# Patient Record
Sex: Female | Born: 1950 | Race: White | Hispanic: No | Marital: Married | Smoking: Never smoker
Health system: Southern US, Community
[De-identification: ages and names within clinical notes are randomized; demographics above are authoritative.]

## PROBLEM LIST (undated history)

## (undated) DIAGNOSIS — F329 Major depressive disorder, single episode, unspecified: Secondary | ICD-10-CM

## (undated) DIAGNOSIS — Z8042 Family history of malignant neoplasm of prostate: Secondary | ICD-10-CM

## (undated) DIAGNOSIS — R112 Nausea with vomiting, unspecified: Secondary | ICD-10-CM

## (undated) DIAGNOSIS — J302 Other seasonal allergic rhinitis: Secondary | ICD-10-CM

## (undated) DIAGNOSIS — Z803 Family history of malignant neoplasm of breast: Secondary | ICD-10-CM

## (undated) DIAGNOSIS — T4145XA Adverse effect of unspecified anesthetic, initial encounter: Secondary | ICD-10-CM

## (undated) DIAGNOSIS — F419 Anxiety disorder, unspecified: Secondary | ICD-10-CM

## (undated) DIAGNOSIS — Z9889 Other specified postprocedural states: Secondary | ICD-10-CM

## (undated) DIAGNOSIS — K219 Gastro-esophageal reflux disease without esophagitis: Secondary | ICD-10-CM

## (undated) DIAGNOSIS — T8859XA Other complications of anesthesia, initial encounter: Secondary | ICD-10-CM

## (undated) DIAGNOSIS — M1990 Unspecified osteoarthritis, unspecified site: Secondary | ICD-10-CM

## (undated) DIAGNOSIS — F32A Depression, unspecified: Secondary | ICD-10-CM

## (undated) DIAGNOSIS — K5730 Diverticulosis of large intestine without perforation or abscess without bleeding: Secondary | ICD-10-CM

## (undated) HISTORY — PX: COLONOSCOPY: SHX174

## (undated) HISTORY — PX: EYE SURGERY: SHX253

## (undated) HISTORY — PX: UPPER GASTROINTESTINAL ENDOSCOPY: SHX188

## (undated) HISTORY — PX: TONSILLECTOMY: SUR1361

## (undated) HISTORY — DX: Family history of malignant neoplasm of prostate: Z80.42

## (undated) HISTORY — DX: Family history of malignant neoplasm of breast: Z80.3

---

## 1989-09-03 HISTORY — PX: ABDOMINAL HYSTERECTOMY: SHX81

## 2000-01-25 ENCOUNTER — Encounter: Payer: Self-pay | Admitting: Family Medicine

## 2000-01-25 ENCOUNTER — Encounter: Admission: RE | Admit: 2000-01-25 | Discharge: 2000-01-25 | Payer: Self-pay | Admitting: Family Medicine

## 2001-01-28 ENCOUNTER — Encounter: Admission: RE | Admit: 2001-01-28 | Discharge: 2001-01-28 | Payer: Self-pay | Admitting: Family Medicine

## 2001-01-28 ENCOUNTER — Encounter: Payer: Self-pay | Admitting: Family Medicine

## 2002-01-29 ENCOUNTER — Encounter: Admission: RE | Admit: 2002-01-29 | Discharge: 2002-01-29 | Payer: Self-pay | Admitting: Family Medicine

## 2002-01-29 ENCOUNTER — Encounter: Payer: Self-pay | Admitting: Family Medicine

## 2003-12-08 ENCOUNTER — Emergency Department (HOSPITAL_COMMUNITY): Admission: EM | Admit: 2003-12-08 | Discharge: 2003-12-08 | Payer: Self-pay | Admitting: Emergency Medicine

## 2004-01-07 ENCOUNTER — Encounter: Admission: RE | Admit: 2004-01-07 | Discharge: 2004-01-07 | Payer: Self-pay | Admitting: Family Medicine

## 2005-01-24 ENCOUNTER — Encounter: Admission: RE | Admit: 2005-01-24 | Discharge: 2005-01-24 | Payer: Self-pay | Admitting: Family Medicine

## 2005-07-12 ENCOUNTER — Encounter: Admission: RE | Admit: 2005-07-12 | Discharge: 2005-07-12 | Payer: Self-pay | Admitting: Family Medicine

## 2005-09-03 HISTORY — PX: FINGER ARTHROPLASTY: SHX5017

## 2005-09-27 ENCOUNTER — Ambulatory Visit (HOSPITAL_BASED_OUTPATIENT_CLINIC_OR_DEPARTMENT_OTHER): Admission: RE | Admit: 2005-09-27 | Discharge: 2005-09-27 | Payer: Self-pay | Admitting: Orthopedic Surgery

## 2006-01-25 ENCOUNTER — Encounter: Admission: RE | Admit: 2006-01-25 | Discharge: 2006-01-25 | Payer: Self-pay | Admitting: Family Medicine

## 2006-02-01 ENCOUNTER — Other Ambulatory Visit: Admission: RE | Admit: 2006-02-01 | Discharge: 2006-02-01 | Payer: Self-pay | Admitting: Obstetrics and Gynecology

## 2006-08-30 ENCOUNTER — Encounter (INDEPENDENT_AMBULATORY_CARE_PROVIDER_SITE_OTHER): Payer: Self-pay | Admitting: Specialist

## 2006-08-30 ENCOUNTER — Ambulatory Visit (HOSPITAL_BASED_OUTPATIENT_CLINIC_OR_DEPARTMENT_OTHER): Admission: RE | Admit: 2006-08-30 | Discharge: 2006-08-30 | Payer: Self-pay | Admitting: Orthopedic Surgery

## 2007-01-28 ENCOUNTER — Encounter: Admission: RE | Admit: 2007-01-28 | Discharge: 2007-01-28 | Payer: Self-pay | Admitting: Family Medicine

## 2007-03-10 ENCOUNTER — Ambulatory Visit: Payer: Self-pay

## 2008-01-13 ENCOUNTER — Encounter: Admission: RE | Admit: 2008-01-13 | Discharge: 2008-01-13 | Payer: Self-pay | Admitting: Family Medicine

## 2008-02-12 ENCOUNTER — Encounter: Admission: RE | Admit: 2008-02-12 | Discharge: 2008-02-12 | Payer: Self-pay | Admitting: Family Medicine

## 2008-02-27 ENCOUNTER — Encounter: Admission: RE | Admit: 2008-02-27 | Discharge: 2008-02-27 | Payer: Self-pay | Admitting: Family Medicine

## 2009-02-15 ENCOUNTER — Encounter: Admission: RE | Admit: 2009-02-15 | Discharge: 2009-02-15 | Payer: Self-pay | Admitting: Family Medicine

## 2010-03-14 ENCOUNTER — Encounter: Admission: RE | Admit: 2010-03-14 | Discharge: 2010-03-14 | Payer: Self-pay | Admitting: Family Medicine

## 2010-09-23 ENCOUNTER — Encounter: Payer: Self-pay | Admitting: Family Medicine

## 2010-09-24 ENCOUNTER — Encounter: Payer: Self-pay | Admitting: Family Medicine

## 2010-09-29 ENCOUNTER — Encounter
Admission: RE | Admit: 2010-09-29 | Discharge: 2010-09-29 | Payer: Self-pay | Source: Home / Self Care | Attending: Family Medicine | Admitting: Family Medicine

## 2010-10-02 ENCOUNTER — Other Ambulatory Visit: Payer: Self-pay | Admitting: Family Medicine

## 2010-10-02 DIAGNOSIS — K7689 Other specified diseases of liver: Secondary | ICD-10-CM

## 2010-10-05 ENCOUNTER — Ambulatory Visit
Admission: RE | Admit: 2010-10-05 | Discharge: 2010-10-05 | Disposition: A | Discharge status: Home / Self Care | Payer: BC Managed Care – PPO | Source: Ambulatory Visit | Attending: Family Medicine | Admitting: Family Medicine

## 2010-10-05 DIAGNOSIS — K7689 Other specified diseases of liver: Secondary | ICD-10-CM

## 2010-10-05 MED ORDER — GADOBENATE DIMEGLUMINE 529 MG/ML IV SOLN: 0.1000 mmol/kg | Freq: Once | INTRAVENOUS | Status: AC | PRN

## 2011-01-19 NOTE — Op Note (Signed)
NAMEFREDERICK, KLINGER                ACCOUNT NO.:  [id number]   MEDICAL RECORD NO.:  [id number]          PATIENT TYPE:  AMB   LOCATION:  DSC                          FACILITY:  [facility]   PHYSICIAN:  Katy Fitch. Sypher, M.D. 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   POSTOPERATIVE DIAGNOSIS:  Degenerative arthritis, left small finger at  distal interphalangeal joint with disabling pain with identification of bone-  on-bone arthropathy at DIP joint and 4+ synovitis.   OPERATION:  Synovectomy followed by arthrodesis of left small finger DIP  joint utilizing two 0.028 inch Kirschner wires and autogenous bone graft.   OPERATING SURGEON:  Katy Fitch. Sypher, M.D.   ASSISTANT:  Molly Maduro Dasnoit PA-C.   ANESTHESIA:  General by LMA.   SUPERVISING ANESTHESIOLOGIST:  Dr. Gelene Mink.   INDICATIONS:  Catherine Rojas is a 60 year old woman referred for evaluation  and management of a painful left small finger DIP joint. Clinical  examination revealed bone-on-bone arthropathy with a radial deviation of the  distal phalanx.   We discussed treatment options. She has failed nonoperative measures. She is  brought to the operating at this time after requesting arthrodesis for pain  control.   PROCEDURE:  Catherine Rojas is brought to the operating room and placed in the  supine position upon the operating table.   Following the induction of general anesthesia by LMA technique, the left arm  was prepped with Betadine soap and solution and sterilely draped. Catherine Rojas  was noted to have latex allergy therefore a latex-free protocol was  followed.   Ancef 1 g was administered as an IV prophylactic antibiotic.   Following exsanguination of the left arm with an Esmarch bandage, an  arterial tourniquet  on the proximal brachium was inflated to 220 mmHg.  The  procedure commenced with a curvilinear incision over the dorsal aspect of  the left small finger DIP joint. Care was taken to identify the extensor  mechanism and protect the dorsal sensory nerves.   The extensor was transected 3 mm proximal to the insertion at the distal  phalanx. There was a large dorsal osteophyte at the base of distal phalanx  noted. The collateral ligaments released and the joint opened shotgun style.   With fine rongeur and a power bur, a cup and cone arthrodesis was fashioned.  Autogenous graft was harvested from the condyles of the middle phalanx  cleared of any remnants of hyaline cartilage and synovium and processed to a  bone meal to be used as a bone graft.   After the cup and cone shaping of the distal and middle phalanges was  accomplished, the bone graft was packed between the bones and two 0.028 inch  Kirschner wires were used with a retrograde technique to secure the DIP  joint in 15 degrees of flexion.   Care was taken not to cross the DIP joint.   The placement  of the pins in the DIP joint was confirmed with a C-arm  fluoroscope followed by repair of the extensor mechanism with mattress  suture of 4-0 Mersilene. The skin was repaired with mattress suture of 5-0  nylon.   There were no apparent complications. Catherine Rojas was placed in a compressive  dressing with sandwich splints maintaining the ring and small fingers in the  safe position.   There were no apparent complications.      Katy Fitch Sypher, M.D.  Electronically Signed     RVS/MEDQ  D:  09/27/2005  T:  09/27/2005  Job:  [id number]

## 2011-03-23 ENCOUNTER — Other Ambulatory Visit: Payer: Self-pay | Admitting: Family Medicine

## 2011-03-23 DIAGNOSIS — Z1231 Encounter for screening mammogram for malignant neoplasm of breast: Secondary | ICD-10-CM

## 2011-03-30 ENCOUNTER — Ambulatory Visit
Admission: RE | Admit: 2011-03-30 | Discharge: 2011-03-30 | Disposition: A | Discharge status: Home / Self Care | Payer: BC Managed Care – PPO | Source: Ambulatory Visit | Attending: Family Medicine | Admitting: Family Medicine

## 2011-03-30 DIAGNOSIS — Z1231 Encounter for screening mammogram for malignant neoplasm of breast: Secondary | ICD-10-CM

## 2011-05-09 ENCOUNTER — Other Ambulatory Visit: Payer: Self-pay | Admitting: Gastroenterology

## 2011-06-25 ENCOUNTER — Other Ambulatory Visit: Payer: Self-pay | Admitting: Dermatology

## 2011-11-01 ENCOUNTER — Other Ambulatory Visit: Payer: Self-pay | Admitting: Family Medicine

## 2011-11-21 ENCOUNTER — Ambulatory Visit
Admission: RE | Admit: 2011-11-21 | Discharge: 2011-11-21 | Disposition: A | Discharge status: Home / Self Care | Payer: BC Managed Care – PPO | Source: Ambulatory Visit | Attending: Family Medicine | Admitting: Family Medicine

## 2011-12-13 ENCOUNTER — Other Ambulatory Visit: Payer: Self-pay | Admitting: Orthopedic Surgery

## 2011-12-14 ENCOUNTER — Encounter (HOSPITAL_BASED_OUTPATIENT_CLINIC_OR_DEPARTMENT_OTHER): Payer: Self-pay | Admitting: *Deleted

## 2011-12-14 NOTE — Progress Notes (Signed)
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 She had finger surg here 07

## 2011-12-19 NOTE — H&P (Signed)
Catherine Rojas is an 61 y.o. female.   Chief Complaint: c/o chronic and progressive left hand numbness and tingling HPI: . 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 She has had a 2 month history of progressive bilateral hand pain, numbness and nocturnal dysesthesias. She has been wearing a wrist forearm splint on the right without relief. She was seen for an evaluation of her hands and was referred for an upper extremity orthopaedic consult.    Past Medical History  Diagnosis Date  . Complication of anesthesia     n/v  . PONV (postoperative nausea and vomiting)   . Anxiety   . Depression   . GERD (gastroesophageal reflux disease)   . Diverticular disease of colon   . Arthritis   . Seasonal allergies     Past Surgical History  Procedure Date  . Eye surgery     both cataracts-lense implants  . Finger arthroplasty 2007    lt small finger graft  . Colonoscopy   . Upper gastrointestinal endoscopy   . Abdominal hysterectomy 1991  . Tonsillectomy     No family history on file. Social History:  reports that she has never smoked. She does not have any smokeless tobacco history on file. She reports that she drinks alcohol. She reports that she does not use illicit drugs.  Allergies:  Allergies  Allergen Reactions  . Latex Itching    No current facility-administered medications on file as of .   Medications Prior to Admission  Medication Sig Dispense Refill  . ALPRAZolam (XANAX) 0.5 MG tablet Take 0.5 mg by mouth 2 (two) times daily at 10 am and 4 pm.      . ALPRAZolam (XANAX) 1 MG tablet Take 1 mg by mouth at bedtime.      Marland Kitchen estradiol (ESTRACE) 0.5 MG tablet Take 0.5 mg by mouth daily.      Marland Kitchen FLUoxetine (PROZAC) 20 MG capsule Take 20 mg by mouth daily.      Marland Kitchen loratadine (CLARITIN) 10 MG tablet Take 10 mg by mouth as needed.      . pantoprazole (PROTONIX) 40 MG tablet Take 40 mg by mouth daily.        No results found for this or any previous visit  (from the past 48 hour(s)).  No results found.   Pertinent items are noted in HPI.  Height 5' 3.5" (1.613 m), weight 71.215 kg (157 lb).  General appearance: alert Head: Normocephalic, without obvious abnormality Neck: supple, symmetrical, trachea midline Resp: clear to auscultation bilaterally Cardio: regular rate and rhythm, S1, S2 normal, no murmur, click, rub or gallop GI: normal findings: bowel sounds normal Extremities: . Inspection of her hands reveals no thenar atrophy. She has full ROM of her fingers in flexion/extension. Her pulses and capillary refill are intact. She has no sign of stenosing tenosynovitis. Her wrist flexion test is positive bilaterally. Her Tinel's sign is positive bilaterally. She does not show signs of ulnar entrapment neuropathy.  Dr. Johna Roles completed detailed electrodiagnostic studies. She is noted to have moderately severe bilateral carpal tunnel syndrome. She has low sensory amplitudes left worse than right.  Pulses: 2+ and symmetric Skin: normal Neurologic: Grossly normal    Assessment/Plan Assessment: Bilateral carpal tunnel syndrome left worse than right.   Plan: We will schedule her for release of her left transverse carpal ligament followed by release of the right transverse carpal ligament 2-3 weeks following the left side. The surgery, after care, risks and  benefits were described in detail. Questions were invited and answered in detail.     DASNOIT,Arelis Neumeier J 12/19/2011, 4:22 PM   H&P documentation: 12/20/2011  -History and Physical Reviewed  -Patient has been re-examined  -No change in the plan of care  Wyn Forster, MD

## 2011-12-20 ENCOUNTER — Ambulatory Visit (HOSPITAL_BASED_OUTPATIENT_CLINIC_OR_DEPARTMENT_OTHER)
Admission: RE | Admit: 2011-12-20 | Discharge: 2011-12-20 | Disposition: A | Discharge status: Home / Self Care | Payer: BC Managed Care – PPO | Source: Ambulatory Visit | Attending: Orthopedic Surgery | Admitting: Orthopedic Surgery

## 2011-12-20 ENCOUNTER — Encounter (HOSPITAL_BASED_OUTPATIENT_CLINIC_OR_DEPARTMENT_OTHER): Payer: Self-pay | Admitting: Anesthesiology

## 2011-12-20 ENCOUNTER — Ambulatory Visit (HOSPITAL_BASED_OUTPATIENT_CLINIC_OR_DEPARTMENT_OTHER): Payer: BC Managed Care – PPO | Admitting: Anesthesiology

## 2011-12-20 ENCOUNTER — Encounter (HOSPITAL_BASED_OUTPATIENT_CLINIC_OR_DEPARTMENT_OTHER): Payer: Self-pay | Admitting: *Deleted

## 2011-12-20 ENCOUNTER — Encounter (HOSPITAL_BASED_OUTPATIENT_CLINIC_OR_DEPARTMENT_OTHER)
Admission: RE | Disposition: A | Discharge status: Home / Self Care | Payer: Self-pay | Source: Ambulatory Visit | Attending: Orthopedic Surgery

## 2011-12-20 DIAGNOSIS — K219 Gastro-esophageal reflux disease without esophagitis: Secondary | ICD-10-CM | POA: Insufficient documentation

## 2011-12-20 DIAGNOSIS — G5600 Carpal tunnel syndrome, unspecified upper limb: Secondary | ICD-10-CM | POA: Insufficient documentation

## 2011-12-20 HISTORY — DX: Diverticulosis of large intestine without perforation or abscess without bleeding: K57.30

## 2011-12-20 HISTORY — DX: Other complications of anesthesia, initial encounter: T88.59XA

## 2011-12-20 HISTORY — DX: Anxiety disorder, unspecified: F41.9

## 2011-12-20 HISTORY — DX: Other specified postprocedural states: Z98.890

## 2011-12-20 HISTORY — DX: Major depressive disorder, single episode, unspecified: F32.9

## 2011-12-20 HISTORY — DX: Unspecified osteoarthritis, unspecified site: M19.90

## 2011-12-20 HISTORY — DX: Nausea with vomiting, unspecified: R11.2

## 2011-12-20 HISTORY — DX: Adverse effect of unspecified anesthetic, initial encounter: T41.45XA

## 2011-12-20 HISTORY — PX: CARPAL TUNNEL RELEASE: SHX101

## 2011-12-20 HISTORY — DX: Gastro-esophageal reflux disease without esophagitis: K21.9

## 2011-12-20 HISTORY — DX: Depression, unspecified: F32.A

## 2011-12-20 HISTORY — DX: Other seasonal allergic rhinitis: J30.2

## 2011-12-20 LAB — POCT HEMOGLOBIN-HEMACUE: Hemoglobin: 14.3 g/dL (ref 12.0–15.0)

## 2011-12-20 SURGERY — CARPAL TUNNEL RELEASE
Anesthesia: Choice | Laterality: Left | Wound class: Clean

## 2011-12-20 MED ORDER — PROPOFOL 10 MG/ML IV EMUL
INTRAVENOUS | Status: DC | PRN
  Administered 2011-12-20: 200 mg via INTRAVENOUS

## 2011-12-20 MED ORDER — FENTANYL CITRATE 0.05 MG/ML IJ SOLN
INTRAMUSCULAR | Status: DC | PRN
  Administered 2011-12-20: 50 ug via INTRAVENOUS

## 2011-12-20 MED ORDER — HYDROCODONE-ACETAMINOPHEN 5-325 MG PO TABS: ORAL_TABLET | ORAL | Status: AC

## 2011-12-20 MED ORDER — MIDAZOLAM HCL 5 MG/5ML IJ SOLN
INTRAMUSCULAR | Status: DC | PRN
  Administered 2011-12-20: 2 mg via INTRAVENOUS

## 2011-12-20 MED ORDER — METOCLOPRAMIDE HCL 5 MG/ML IJ SOLN: 10.0000 mg | Freq: Once | INTRAMUSCULAR | Status: DC | PRN

## 2011-12-20 MED ORDER — DEXAMETHASONE SODIUM PHOSPHATE 4 MG/ML IJ SOLN
INTRAMUSCULAR | Status: DC | PRN
  Administered 2011-12-20: 10 mg via INTRAVENOUS

## 2011-12-20 MED ORDER — MORPHINE SULFATE 4 MG/ML IJ SOLN: 0.0500 mg/kg | INTRAMUSCULAR | Status: DC | PRN

## 2011-12-20 MED ORDER — LACTATED RINGERS IV SOLN
INTRAVENOUS | Status: DC
  Administered 2011-12-20 (×2): via INTRAVENOUS

## 2011-12-20 MED ORDER — DROPERIDOL 2.5 MG/ML IJ SOLN
INTRAMUSCULAR | Status: DC | PRN
  Administered 2011-12-20: 0.625 mg via INTRAVENOUS

## 2011-12-20 MED ORDER — HYDROCODONE-ACETAMINOPHEN 5-325 MG PO TABS: 1.0000 | ORAL_TABLET | Freq: Four times a day (QID) | ORAL | Status: DC | PRN

## 2011-12-20 MED ORDER — CHLORHEXIDINE GLUCONATE 4 % EX LIQD: 60.0000 mL | Freq: Once | CUTANEOUS | Status: DC

## 2011-12-20 MED ORDER — LIDOCAINE HCL 2 % IJ SOLN
INTRAMUSCULAR | Status: DC | PRN
  Administered 2011-12-20: 4 mL

## 2011-12-20 MED ORDER — FENTANYL CITRATE 0.05 MG/ML IJ SOLN
25.0000 ug | INTRAMUSCULAR | Status: DC | PRN
  Administered 2011-12-20: 25 ug via INTRAVENOUS

## 2011-12-20 SURGICAL SUPPLY — 39 items
BANDAGE ADHESIVE 1X3 (GAUZE/BANDAGES/DRESSINGS) IMPLANT
BANDAGE ELASTIC 3 VELCRO ST LF (GAUZE/BANDAGES/DRESSINGS) ×2 IMPLANT
BLADE SURG 15 STRL LF DISP TIS (BLADE) ×1 IMPLANT
BLADE SURG 15 STRL SS (BLADE) ×1
BNDG ESMARK 4X9 LF (GAUZE/BANDAGES/DRESSINGS) IMPLANT
BRUSH SCRUB EZ PLAIN DRY (MISCELLANEOUS) ×2 IMPLANT
CLOTH BEACON ORANGE TIMEOUT ST (SAFETY) ×2 IMPLANT
CORDS BIPOLAR (ELECTRODE) ×2 IMPLANT
COVER MAYO STAND STRL (DRAPES) ×2 IMPLANT
COVER TABLE BACK 60X90 (DRAPES) ×2 IMPLANT
CUFF TOURNIQUET SINGLE 18IN (TOURNIQUET CUFF) ×2 IMPLANT
DECANTER SPIKE VIAL GLASS SM (MISCELLANEOUS) IMPLANT
DRAPE EXTREMITY T 121X128X90 (DRAPE) ×2 IMPLANT
DRAPE SURG 17X23 STRL (DRAPES) ×2 IMPLANT
GLOVE BIOGEL M STRL SZ7.5 (GLOVE) IMPLANT
GLOVE ORTHO TXT STRL SZ7.5 (GLOVE) IMPLANT
GLOVE SKINSENSE NS SZ7.0 (GLOVE) ×2
GLOVE SKINSENSE NS SZ7.5 (GLOVE) ×1
GLOVE SKINSENSE STRL SZ7.0 (GLOVE) ×2 IMPLANT
GLOVE SKINSENSE STRL SZ7.5 (GLOVE) ×1 IMPLANT
GOWN PREVENTION PLUS XLARGE (GOWN DISPOSABLE) ×2 IMPLANT
GOWN PREVENTION PLUS XXLARGE (GOWN DISPOSABLE) ×4 IMPLANT
NEEDLE 27GAX1X1/2 (NEEDLE) ×2 IMPLANT
PACK BASIN DAY SURGERY FS (CUSTOM PROCEDURE TRAY) ×2 IMPLANT
PAD CAST 3X4 CTTN HI CHSV (CAST SUPPLIES) ×1 IMPLANT
PADDING CAST ABS 4INX4YD NS (CAST SUPPLIES) ×1
PADDING CAST ABS COTTON 4X4 ST (CAST SUPPLIES) ×1 IMPLANT
PADDING CAST COTTON 3X4 STRL (CAST SUPPLIES) ×1
SPLINT PLASTER CAST XFAST 3X15 (CAST SUPPLIES) ×5 IMPLANT
SPLINT PLASTER XTRA FASTSET 3X (CAST SUPPLIES) ×5
SPONGE GAUZE 4X4 12PLY (GAUZE/BANDAGES/DRESSINGS) ×2 IMPLANT
STOCKINETTE 4X48 STRL (DRAPES) ×2 IMPLANT
STRIP CLOSURE SKIN 1/2X4 (GAUZE/BANDAGES/DRESSINGS) ×2 IMPLANT
SUT PROLENE 3 0 PS 2 (SUTURE) ×2 IMPLANT
SYR 3ML 23GX1 SAFETY (SYRINGE) IMPLANT
SYR CONTROL 10ML LL (SYRINGE) ×2 IMPLANT
TRAY DSU PREP LF (CUSTOM PROCEDURE TRAY) ×2 IMPLANT
UNDERPAD 30X30 INCONTINENT (UNDERPADS AND DIAPERS) ×2 IMPLANT
WATER STERILE IRR 1000ML POUR (IV SOLUTION) ×2 IMPLANT

## 2011-12-20 NOTE — Brief Op Note (Signed)
12/20/2011  11:04 AM  PATIENT:  Catherine Rojas  61 y.o. female  PRE-OPERATIVE DIAGNOSIS:   carpal tunnel syndrome left  POST-OPERATIVE DIAGNOSIS:   carpal tunnel syndrome left  PROCEDURE: CARPAL TUNNEL RELEASE (Left)  SURGEON: Wyn Forster., MD   PHYSICIAN ASSISTANT:   ASSISTANTS: Mallory Shirk.A-C   ANESTHESIA:   general  EBL:     BLOOD ADMINISTERED:none  DRAINS: none   LOCAL MEDICATIONS USED:  XYLOCAINE   SPECIMEN:  No Specimen  DISPOSITION OF SPECIMEN:  N/A  COUNTS:  correct  TOURNIQUET:   Total Tourniquet Time Documented: Upper Arm (Left) - 11 minutes  DICTATION: .Other Dictation: Dictation Number [id number]  PLAN OF CARE: Discharge to home after PACU  PATIENT DISPOSITION:  PACU - hemodynamically stable.

## 2011-12-20 NOTE — Op Note (Signed)
Op note dictated [id number]

## 2011-12-20 NOTE — Anesthesia Preprocedure Evaluation (Addendum)
Anesthesia Evaluation  Patient identified by MRN, date of birth, ID band Patient awake    Reviewed: Allergy & Precautions, H&P , NPO status , Patient's Chart, lab work & pertinent test results, reviewed documented beta blocker date and time   History of Anesthesia Complications (+) PONV  Airway Mallampati: II TM Distance: >3 FB Neck ROM: full    Dental   Pulmonary neg pulmonary ROS,          Cardiovascular negative cardio ROS      Neuro/Psych PSYCHIATRIC DISORDERS negative neurological ROS     GI/Hepatic Neg liver ROS, GERD-  Medicated and Controlled,  Endo/Other  negative endocrine ROS  Renal/GU negative Renal ROS  negative genitourinary   Musculoskeletal   Abdominal   Peds  Hematology negative hematology ROS (+)   Anesthesia Other Findings See surgeon's H&P   Reproductive/Obstetrics negative OB ROS                           Anesthesia Physical Anesthesia Plan  ASA: II  Anesthesia Plan: General   Post-op Pain Management:    Induction: Intravenous  Airway Management Planned: LMA  Additional Equipment:   Intra-op Plan:   Post-operative Plan: Extubation in OR  Informed Consent: I have reviewed the patients History and Physical, chart, labs and discussed the procedure including the risks, benefits and alternatives for the proposed anesthesia with the patient or authorized representative who has indicated his/her understanding and acceptance.     Plan Discussed with: CRNA and Surgeon  Anesthesia Plan Comments:        Anesthesia Quick Evaluation

## 2011-12-20 NOTE — Anesthesia Postprocedure Evaluation (Signed)
Anesthesia Post Note  Patient: Catherine Rojas  Procedure(s) Performed: Procedure(s) (LRB): CARPAL TUNNEL RELEASE (Left)  Anesthesia type: General  Patient location: PACU  Post pain: Pain level controlled  Post assessment: Patient's Cardiovascular Status Stable  Last Vitals:  Filed Vitals:   12/20/11 1200  BP: 125/75  Pulse: 83  Temp:   Resp: 30    Post vital signs: Reviewed and stable  Level of consciousness: alert  Complications: No apparent anesthesia complications

## 2011-12-20 NOTE — Transfer of Care (Signed)
Immediate Anesthesia Transfer of Care Note  Patient: Catherine Rojas  Procedure(s) Performed: Procedure(s) (LRB): CARPAL TUNNEL RELEASE (Left)  Patient Location: PACU  Anesthesia Type: General  Level of Consciousness: awake, alert  and oriented  Airway & Oxygen Therapy: Patient Spontanous Breathing and Patient connected to face mask oxygen  Post-op Assessment: Report given to PACU RN and Post -op Vital signs reviewed and stable  Post vital signs: Reviewed and stable  Complications: No apparent anesthesia complications

## 2011-12-20 NOTE — Anesthesia Procedure Notes (Addendum)
Performed by: Gar Gibbon    Date/Time: 12/20/2011 10:45 AM Performed by: Gar Gibbon    Procedure Name: LMA Insertion Date/Time: 12/20/2011 10:45 AM Performed by: Gar Gibbon Pre-anesthesia Checklist: Patient identified, Emergency Drugs available, Suction available and Patient being monitored Patient Re-evaluated:Patient Re-evaluated prior to inductionOxygen Delivery Method: Circle System Utilized Preoxygenation: Pre-oxygenation with 100% oxygen Intubation Type: IV induction Ventilation: Mask ventilation without difficulty LMA: LMA inserted LMA Size: 4.0 Number of attempts: 1 Airway Equipment and Method: bite block Placement Confirmation: positive ETCO2 Tube secured with: Tape Dental Injury: Teeth and Oropharynx as per pre-operative assessment

## 2011-12-20 NOTE — Discharge Instructions (Addendum)

## 2011-12-21 NOTE — Op Note (Signed)
NAME:  Catherine Rojas, Catherine Rojas                     ACCOUNT NO.:  MEDICAL RECORD NO.:  [id number]  LOCATION:                                 FACILITY:  PHYSICIAN:  Katy Fitch. Kenedee Molesky, M.D.      DATE OF BIRTH:  DATE OF PROCEDURE:  12/20/2011 DATE OF DISCHARGE:                              OPERATIVE REPORT   POSTOPERATIVE DIAGNOSIS:  Chronic left carpal tunnel syndrome.  POSTOPERATIVE DIAGNOSIS:  Chronic left carpal tunnel syndrome.  OPERATIONS:  Release of left transverse carpal ligament.  OPERATING SURGEON:  Katy Fitch. Camelle Henkels, M.D.  ASSISTANT:  Jonni Sanger, P.A.  ANESTHESIA:  General by LMA.  SUPERVISING ANESTHESIOLOGIST:  Janetta Hora. Gelene Mink, M.D.  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  She had chronic symptoms for years.  His clinical examination reveals signs of carpal tunnel syndrome. Electrodiagnostic studies revealed evidence of bilateral carpal tunnel syndrome.  Due to a failure to respond to nonoperative measures, she is brought to the operating at this time for release of her left transverse carpal ligament.  PROCEDURE IN DETAIL:  Catherine Rojas was brought to Room 6 of [facility] and placed in supine position upon the operating table.  Following induction of general anesthesia by LMA technique, the left arm and hand was prepped with Betadine soap and solution, sterilely draped. Upon exsanguination of the left arm with Esmarch bandage, arterial tourniquet on the proximal brachium was inflated to 250 mmHg.  Latex free precautions were followed due to her latex intolerance.  Routine surgical time-out was accomplished followed by proceeding with a short incision in the line of the ring finger and the palm. Subcutaneous tissues were carefully divided to reveal the palmar fascia. Fibers of the palmar fascia split longitudinally to the common sense branch of the median nerve.  These were followed back  to the median nerve proper which was gently isolated from transcarpal ligament.  The adipose tissue made visualization somewhat challenging.  Multiple retractors were placed.  We sequentially released the transcarpal ligament scissors proximally extending into the distal forearm.  The volar forearm fascia split with scissors, taking care to remain ulnar to the position of the median nerve.  A swivel parotid retractor was placed to allow visualization of the released fascia well into the forearm.  The muscle belly of the superficialis were noted.  The wound was then inspected for bleeding points which were electrocauterized with bipolar current followed by repair of the skin with intradermal 3-0 Prolene suture.  A compressive dressing was applied with a volar plaster splint maintaining the wrist in 10 degrees of dorsiflexion.  For aftercare, Catherine Rojas was provided prescription for hydrocodone 5 mg 1 p.o. q.4 hours p.r.n. pain, 20 tablets, without refill.     Katy Fitch Ginnette Gates, M.D.     RVS/MEDQ  D:  12/20/2011  T:  12/20/2011  Job:  [id number]

## 2011-12-24 ENCOUNTER — Encounter (HOSPITAL_BASED_OUTPATIENT_CLINIC_OR_DEPARTMENT_OTHER): Payer: Self-pay | Admitting: Orthopedic Surgery

## 2012-01-15 ENCOUNTER — Other Ambulatory Visit: Payer: Self-pay | Admitting: Orthopedic Surgery

## 2012-01-15 NOTE — Progress Notes (Signed)
Had lt ctr 4/13-did well No labs needed

## 2012-01-16 NOTE — H&P (Signed)
  Catherine Rojas is an 61 y.o. female.   Chief Complaint: c/o chronic and progressive numbness and tingling right hand HPI: Pt. Is a 61 y/o right handed female who presented to our office several months ago c/o progressive numbness and tingling bilateral hands. Evaluation in our office with NCV's revealed bilateral CTS. The patient underwent left CTR on 12/20/11. She did well in the post-op period and now requests surgery to the right hand.  Past Medical History  Diagnosis Date  . Complication of anesthesia     n/v  . PONV (postoperative nausea and vomiting)   . Anxiety   . Depression   . GERD (gastroesophageal reflux disease)   . Diverticular disease of colon   . Arthritis   . Seasonal allergies     Past Surgical History  Procedure Date  . Eye surgery     both cataracts-lense implants  . Finger arthroplasty 2007    lt small finger graft  . Colonoscopy   . Upper gastrointestinal endoscopy   . Abdominal hysterectomy 1991  . Tonsillectomy   . Carpal tunnel release 12/20/2011    Procedure: CARPAL TUNNEL RELEASE;  Surgeon: Wyn Forster., MD;  Location: [facility];  Service: Orthopedics;  Laterality: Left;    No family history on file. Social History:  reports that she has never smoked. She does not have any smokeless tobacco history on file. She reports that she drinks alcohol. She reports that she does not use illicit drugs.  Allergies:  Allergies  Allergen Reactions  . Latex Itching    No prescriptions prior to admission    No results found for this or any previous visit (from the past 48 hour(s)).  No results found.   Pertinent items are noted in HPI.  There were no vitals taken for this visit.  General appearance: alert Head: Normocephalic, without obvious abnormality Neck: supple, symmetrical, trachea midline Resp: normal percussion bilaterally Cardio: regular rate and rhythm GI: normal findings: bowel sounds normal Extremities:  Examination of the right hand reveals positive Phalen's and Tinel's. FROM of the digits without signs of triggering. Review of the NCV test reveals moderate right CTS. Pulses: 2+ and symmetric Skin: normal Neurologic: Grossly normal    Assessment/Plan Impression: Right CTS  Plan: To the OR for Right CTR. The procedure,risks and post-op course were discussed with the patient at length and she was in agreement with the plan.  Catherine Rojas,Najma Bozarth J 01/16/2012, 4:26 PM     H&P documentation: 01/17/2012  -History and Physical Reviewed  -Patient has been re-examined  -No change in the plan of care  Wyn Forster, MD

## 2012-01-17 ENCOUNTER — Encounter (HOSPITAL_BASED_OUTPATIENT_CLINIC_OR_DEPARTMENT_OTHER): Payer: Self-pay | Admitting: Anesthesiology

## 2012-01-17 ENCOUNTER — Encounter (HOSPITAL_BASED_OUTPATIENT_CLINIC_OR_DEPARTMENT_OTHER)
Admission: RE | Disposition: A | Discharge status: Home / Self Care | Payer: Self-pay | Source: Ambulatory Visit | Attending: Orthopedic Surgery

## 2012-01-17 ENCOUNTER — Encounter (HOSPITAL_BASED_OUTPATIENT_CLINIC_OR_DEPARTMENT_OTHER): Payer: Self-pay

## 2012-01-17 ENCOUNTER — Ambulatory Visit (HOSPITAL_BASED_OUTPATIENT_CLINIC_OR_DEPARTMENT_OTHER): Payer: BC Managed Care – PPO | Admitting: Anesthesiology

## 2012-01-17 ENCOUNTER — Ambulatory Visit (HOSPITAL_BASED_OUTPATIENT_CLINIC_OR_DEPARTMENT_OTHER)
Admission: RE | Admit: 2012-01-17 | Discharge: 2012-01-17 | Disposition: A | Discharge status: Home / Self Care | Payer: BC Managed Care – PPO | Source: Ambulatory Visit | Attending: Orthopedic Surgery | Admitting: Orthopedic Surgery

## 2012-01-17 DIAGNOSIS — G5600 Carpal tunnel syndrome, unspecified upper limb: Secondary | ICD-10-CM | POA: Insufficient documentation

## 2012-01-17 DIAGNOSIS — K219 Gastro-esophageal reflux disease without esophagitis: Secondary | ICD-10-CM | POA: Insufficient documentation

## 2012-01-17 HISTORY — PX: CARPAL TUNNEL RELEASE: SHX101

## 2012-01-17 SURGERY — CARPAL TUNNEL RELEASE
Anesthesia: General | Site: Hand | Laterality: Right | Wound class: Clean

## 2012-01-17 MED ORDER — METOCLOPRAMIDE HCL 5 MG/ML IJ SOLN: 10.0000 mg | Freq: Once | INTRAMUSCULAR | Status: DC | PRN

## 2012-01-17 MED ORDER — HYDROCODONE-ACETAMINOPHEN 5-325 MG PO TABS
1.0000 | ORAL_TABLET | Freq: Once | ORAL | Status: AC | PRN
Start: 2012-01-17 — End: 2012-01-17
  Administered 2012-01-17: 1 via ORAL

## 2012-01-17 MED ORDER — LIDOCAINE HCL 2 % IJ SOLN
INTRAMUSCULAR | Status: DC | PRN
  Administered 2012-01-17: 4 mL

## 2012-01-17 MED ORDER — FENTANYL CITRATE 0.05 MG/ML IJ SOLN: 25.0000 ug | INTRAMUSCULAR | Status: DC | PRN

## 2012-01-17 MED ORDER — FENTANYL CITRATE 0.05 MG/ML IJ SOLN
INTRAMUSCULAR | Status: DC | PRN
  Administered 2012-01-17 (×2): 50 ug via INTRAVENOUS

## 2012-01-17 MED ORDER — MIDAZOLAM HCL 5 MG/5ML IJ SOLN
INTRAMUSCULAR | Status: DC | PRN
  Administered 2012-01-17: 1 mg via INTRAVENOUS

## 2012-01-17 MED ORDER — LACTATED RINGERS IV SOLN
INTRAVENOUS | Status: DC
  Administered 2012-01-17: 07:00:00 via INTRAVENOUS

## 2012-01-17 MED ORDER — PROPOFOL 10 MG/ML IV EMUL
INTRAVENOUS | Status: DC | PRN
  Administered 2012-01-17: 200 mg via INTRAVENOUS

## 2012-01-17 MED ORDER — LIDOCAINE HCL (CARDIAC) 20 MG/ML IV SOLN
INTRAVENOUS | Status: DC | PRN
  Administered 2012-01-17: 50 mg via INTRAVENOUS

## 2012-01-17 MED ORDER — HYDROCODONE-ACETAMINOPHEN 5-325 MG PO TABS: ORAL_TABLET | ORAL | Status: AC

## 2012-01-17 SURGICAL SUPPLY — 40 items
BANDAGE ADHESIVE 1X3 (GAUZE/BANDAGES/DRESSINGS) IMPLANT
BANDAGE ELASTIC 3 VELCRO ST LF (GAUZE/BANDAGES/DRESSINGS) ×2 IMPLANT
BLADE SURG 15 STRL LF DISP TIS (BLADE) ×1 IMPLANT
BLADE SURG 15 STRL SS (BLADE) ×1
BNDG ESMARK 4X9 LF (GAUZE/BANDAGES/DRESSINGS) ×2 IMPLANT
BRUSH SCRUB EZ PLAIN DRY (MISCELLANEOUS) ×2 IMPLANT
CLOTH BEACON ORANGE TIMEOUT ST (SAFETY) ×2 IMPLANT
CORDS BIPOLAR (ELECTRODE) ×2 IMPLANT
COVER MAYO STAND STRL (DRAPES) ×2 IMPLANT
COVER TABLE BACK 60X90 (DRAPES) ×2 IMPLANT
CUFF TOURNIQUET SINGLE 18IN (TOURNIQUET CUFF) ×2 IMPLANT
DECANTER SPIKE VIAL GLASS SM (MISCELLANEOUS) ×2 IMPLANT
DRAPE EXTREMITY T 121X128X90 (DRAPE) ×2 IMPLANT
DRAPE SURG 17X23 STRL (DRAPES) ×2 IMPLANT
GLOVE BIOGEL M STRL SZ7.5 (GLOVE) IMPLANT
GLOVE BIOGEL PI IND STRL 7.0 (GLOVE) ×1 IMPLANT
GLOVE BIOGEL PI IND STRL 7.5 (GLOVE) ×2 IMPLANT
GLOVE BIOGEL PI INDICATOR 7.0 (GLOVE) ×1
GLOVE BIOGEL PI INDICATOR 7.5 (GLOVE) ×2
GLOVE EXAM NITRILE MD LF STRL (GLOVE) ×2 IMPLANT
GLOVE ORTHO TXT STRL SZ7.5 (GLOVE) IMPLANT
GOWN PREVENTION PLUS XLARGE (GOWN DISPOSABLE) ×2 IMPLANT
GOWN PREVENTION PLUS XXLARGE (GOWN DISPOSABLE) ×4 IMPLANT
NEEDLE 27GAX1X1/2 (NEEDLE) ×2 IMPLANT
PACK BASIN DAY SURGERY FS (CUSTOM PROCEDURE TRAY) ×2 IMPLANT
PAD CAST 3X4 CTTN HI CHSV (CAST SUPPLIES) ×1 IMPLANT
PADDING CAST ABS 4INX4YD NS (CAST SUPPLIES) ×1
PADDING CAST ABS COTTON 4X4 ST (CAST SUPPLIES) ×1 IMPLANT
PADDING CAST COTTON 3X4 STRL (CAST SUPPLIES) ×1
SPLINT PLASTER CAST XFAST 3X15 (CAST SUPPLIES) ×5 IMPLANT
SPLINT PLASTER XTRA FASTSET 3X (CAST SUPPLIES) ×5
SPONGE GAUZE 4X4 12PLY (GAUZE/BANDAGES/DRESSINGS) ×2 IMPLANT
STOCKINETTE 4X48 STRL (DRAPES) ×2 IMPLANT
STRIP CLOSURE SKIN 1/2X4 (GAUZE/BANDAGES/DRESSINGS) ×2 IMPLANT
SUT PROLENE 3 0 PS 2 (SUTURE) ×2 IMPLANT
SYR 3ML 23GX1 SAFETY (SYRINGE) IMPLANT
SYR CONTROL 10ML LL (SYRINGE) ×2 IMPLANT
TRAY DSU PREP LF (CUSTOM PROCEDURE TRAY) ×2 IMPLANT
UNDERPAD 30X30 INCONTINENT (UNDERPADS AND DIAPERS) ×2 IMPLANT
WATER STERILE IRR 1000ML POUR (IV SOLUTION) ×2 IMPLANT

## 2012-01-17 NOTE — Brief Op Note (Signed)
01/17/2012  8:08 AM  PATIENT:  Catherine Rojas  61 y.o. female  PRE-OPERATIVE DIAGNOSIS:  right cts  POST-OPERATIVE DIAGNOSIS:  right cts  PROCEDURE:  Procedure(s) (LRB): CARPAL TUNNEL RELEASE (Right)  SURGEON:  Wyn Forster., MD   PHYSICIAN ASSISTANT:   ASSISTANTS: Mallory Shirk.A-C   ANESTHESIA:   general  EBL:  Total I/O In: 700 [I.V.:700] Out: -   BLOOD ADMINISTERED:none  DRAINS: none   LOCAL MEDICATIONS USED:  XYLOCAINE   SPECIMEN:  No Specimen  DISPOSITION OF SPECIMEN:  N/A  COUNTS:  YES  TOURNIQUET:  * Missing tourniquet times found for documented tourniquets in log:  [id number] *  DICTATION: .Other Dictation: Dictation Number [id number]  PLAN OF CARE: Discharge to home after PACU  PATIENT DISPOSITION:  PACU - hemodynamically stable.

## 2012-01-17 NOTE — Op Note (Signed)
[id number] 

## 2012-01-17 NOTE — Anesthesia Postprocedure Evaluation (Signed)
Anesthesia Post Note  Patient: Catherine Rojas  Procedure(s) Performed: Procedure(s) (LRB): CARPAL TUNNEL RELEASE (Right)  Anesthesia type: General  Patient location: PACU  Post pain: Pain level controlled  Post assessment: Patient's Cardiovascular Status Stable  Last Vitals:  Filed Vitals:   01/17/12 0845  BP: 129/73  Pulse: 84  Temp:   Resp: 15    Post vital signs: Reviewed and stable  Level of consciousness: alert  Complications: No apparent anesthesia complications

## 2012-01-17 NOTE — Anesthesia Procedure Notes (Signed)
Procedure Name: LMA Insertion Date/Time: 01/17/2012 8:01 AM Performed by: Caren Macadam Pre-anesthesia Checklist: Patient identified, Emergency Drugs available, Suction available and Patient being monitored Patient Re-evaluated:Patient Re-evaluated prior to inductionOxygen Delivery Method: Circle System Utilized Preoxygenation: Pre-oxygenation with 100% oxygen Intubation Type: IV induction Ventilation: Mask ventilation without difficulty LMA: LMA inserted LMA Size: 4.0 Number of attempts: 1 Airway Equipment and Method: bite block Placement Confirmation: positive ETCO2 and breath sounds checked- equal and bilateral Tube secured with: Tape Dental Injury: Teeth and Oropharynx as per pre-operative assessment

## 2012-01-17 NOTE — Anesthesia Preprocedure Evaluation (Signed)
Anesthesia Evaluation  Patient identified by MRN, date of birth, ID band Patient awake    Reviewed: Allergy & Precautions, H&P , NPO status , Patient's Chart, lab work & pertinent test results, reviewed documented beta blocker date and time   History of Anesthesia Complications (+) PONV  Airway Mallampati: II TM Distance: >3 FB Neck ROM: full    Dental   Pulmonary neg pulmonary ROS,          Cardiovascular negative cardio ROS      Neuro/Psych PSYCHIATRIC DISORDERS negative neurological ROS     GI/Hepatic Neg liver ROS, GERD-  Medicated and Controlled,  Endo/Other  negative endocrine ROS  Renal/GU negative Renal ROS  negative genitourinary   Musculoskeletal   Abdominal   Peds  Hematology negative hematology ROS (+)   Anesthesia Other Findings See surgeon's H&P   Reproductive/Obstetrics negative OB ROS                           Anesthesia Physical Anesthesia Plan  ASA: II  Anesthesia Plan: General   Post-op Pain Management:    Induction: Intravenous  Airway Management Planned: LMA  Additional Equipment:   Intra-op Plan:   Post-operative Plan: Extubation in OR  Informed Consent: I have reviewed the patients History and Physical, chart, labs and discussed the procedure including the risks, benefits and alternatives for the proposed anesthesia with the patient or authorized representative who has indicated his/her understanding and acceptance.   Dental Advisory Given  Plan Discussed with: CRNA and Surgeon  Anesthesia Plan Comments:         Anesthesia Quick Evaluation

## 2012-01-17 NOTE — Discharge Instructions (Signed)

## 2012-01-17 NOTE — Transfer of Care (Signed)
Immediate Anesthesia Transfer of Care Note  Patient: Catherine Rojas  Procedure(s) Performed: Procedure(s) (LRB): CARPAL TUNNEL RELEASE (Right)  Patient Location: PACU  Anesthesia Type: General  Level of Consciousness: sedated  Airway & Oxygen Therapy: Patient Spontanous Breathing and Patient connected to face mask oxygen  Post-op Assessment: Report given to PACU RN and Post -op Vital signs reviewed and stable  Post vital signs: Reviewed and stable  Complications: No apparent anesthesia complications

## 2012-01-17 NOTE — Op Note (Signed)
Catherine Rojas, Catherine Rojas                ACCOUNT NO.:  [id number]  MEDICAL RECORD NO.:  [id number]  LOCATION:                                 FACILITY:  PHYSICIAN:  Katy Fitch. Neythan Kozlov, M.D.      DATE OF BIRTH:  DATE OF PROCEDURE:  01/17/2012 DATE OF DISCHARGE:                              OPERATIVE REPORT   PREOPERATIVE DIAGNOSIS:  Chronic median entrapment neuropathy, right carpal tunnel.  POSTOPERATIVE DIAGNOSIS:  Chronic median entrapment neuropathy, right carpal tunnel.  OPERATION:  Release of right transverse carpal ligament.  OPERATING SURGEON:  Katy Fitch. Cleo Santucci, MD  ASSISTANT:  Annye Rusk, PA-C  ANESTHESIA:  General by LMA.  SUPERVISING ANESTHESIOLOGIST:  Janetta Hora. Gelene Mink, MD  INDICATIONS:  Catherine Rojas is a 61 year old woman with a history of bilateral carpal tunnel syndrome.  She is status post prior right left carpal tunnel release with very satisfactory result.  She now returns requesting release of her right transverse carpal ligament. Preoperatively, she was reminded of the potential risks and benefits of surgery.  Questions were invited and answered in detail.  PROCEDURE:  Catherine Rojas was brought to room 1 of the [facility] and placed in supine position on the operating table.  Following the induction of general anesthesia by LMA technique under Dr. Thornton Dales direct supervision, the right arm was prepped with Betadine soap and solution and sterilely draped.  Following routine surgical time- out, at which time we acknowledged her latex allergy, the right arm was exsanguinated with an Esmarch bandage and an arterial tourniquet on the proximal right brachium inflated to 220 mmHg.  Procedure commenced with a short incision in the line of the ring finger and the palm. Subcutaneous tissues were carefully divided revealing the palmar fascia. This was split longitudinally to reveal the common sensory branch of the median nerve.  These were followed  back to the transverse carpal ligament, which was gently isolated from the median nerve proper with a Insurance risk surveyor.  There was an ulnar extension of the thenar muscles mandating that we carefully dissect through the ligament looking for an accessory or atypical motor branch.  None was identified.  Ultimately, the transverse carpal ligament was released proximally and the volar forearm fascia was released proximally with scissors.  This widely opened the carpal canal. No masses or other predicaments were noted.  Bleeding points along the margin of the released ligament were electrocauterized with bipolar current followed by repair of the skin with intradermal 3-0 Prolene suture.  A compressive dressing was applied with a volar plaster splint maintaining the wrist in 10 degrees of dorsiflexion.  For aftercare, Catherine Rojas is provided prescription for hydrocodone 5 mg 1 p.o. q.4 h. p.r.n. pain 20 tablets without refill.     Katy Fitch Ewelina Naves, M.D.     RVS/MEDQ  D:  01/17/2012  T:  01/17/2012  Job:  [id number]

## 2012-01-18 ENCOUNTER — Encounter (HOSPITAL_BASED_OUTPATIENT_CLINIC_OR_DEPARTMENT_OTHER): Payer: Self-pay | Admitting: Orthopedic Surgery

## 2012-02-29 ENCOUNTER — Telehealth: Payer: Self-pay | Admitting: *Deleted

## 2012-02-29 ENCOUNTER — Other Ambulatory Visit: Payer: Self-pay | Admitting: Family Medicine

## 2012-02-29 DIAGNOSIS — Z1231 Encounter for screening mammogram for malignant neoplasm of breast: Secondary | ICD-10-CM

## 2012-02-29 NOTE — Telephone Encounter (Signed)
Confirmed 04/17/12 genetic appt w/ pt.  Called Bobbi at referring to make her aware.  Took paperwork to Clydie Braun.

## 2012-04-01 ENCOUNTER — Ambulatory Visit
Admission: RE | Admit: 2012-04-01 | Discharge: 2012-04-01 | Disposition: A | Discharge status: Home / Self Care | Payer: BC Managed Care – PPO | Source: Ambulatory Visit | Attending: Family Medicine | Admitting: Family Medicine

## 2012-04-01 DIAGNOSIS — Z1231 Encounter for screening mammogram for malignant neoplasm of breast: Secondary | ICD-10-CM

## 2012-04-17 ENCOUNTER — Ambulatory Visit (HOSPITAL_BASED_OUTPATIENT_CLINIC_OR_DEPARTMENT_OTHER): Payer: BC Managed Care – PPO | Admitting: Genetic Counselor

## 2012-04-17 ENCOUNTER — Other Ambulatory Visit: Payer: BC Managed Care – PPO | Admitting: Lab

## 2012-04-17 ENCOUNTER — Encounter: Payer: Self-pay | Admitting: Genetic Counselor

## 2012-04-17 DIAGNOSIS — Z800 Family history of malignant neoplasm of digestive organs: Secondary | ICD-10-CM

## 2012-04-17 DIAGNOSIS — Z801 Family history of malignant neoplasm of trachea, bronchus and lung: Secondary | ICD-10-CM

## 2012-04-17 DIAGNOSIS — Z803 Family history of malignant neoplasm of breast: Secondary | ICD-10-CM

## 2012-04-17 DIAGNOSIS — Z8049 Family history of malignant neoplasm of other genital organs: Secondary | ICD-10-CM

## 2012-04-17 NOTE — Progress Notes (Signed)
Dr.  Kevan Ny requested a consultation for genetic counseling and risk assessment for Catherine Rojas, a 61 y.o. female, for discussion of her family history of breast cacner. She presents to clinic today to discuss the possibility of a genetic predisposition to cancer, and to further clarify her risks, as well as her family members' risks for cancer.   HISTORY OF PRESENT ILLNESS: Catherine Rojas is a 61 y.o. female with no personal history of cancer.    Past Medical History  Diagnosis Date  . Complication of anesthesia     n/v  . PONV (postoperative nausea and vomiting)   . Anxiety   . Depression   . GERD (gastroesophageal reflux disease)   . Diverticular disease of colon   . Arthritis   . Seasonal allergies     Past Surgical History  Procedure Date  . Eye surgery     both cataracts-lense implants  . Finger arthroplasty 2007    lt small finger graft  . Colonoscopy   . Upper gastrointestinal endoscopy   . Abdominal hysterectomy 1991  . Tonsillectomy   . Carpal tunnel release 12/20/2011    Procedure: CARPAL TUNNEL RELEASE;  Surgeon: Wyn Forster., MD;  Location: [facility];  Service: Orthopedics;  Laterality: Left;  . Carpal tunnel release 01/17/2012    Procedure: CARPAL TUNNEL RELEASE;  Surgeon: Wyn Forster., MD;  Location: [facility];  Service: Orthopedics;  Laterality: Right;    History  Substance Use Topics  . Smoking status: Never Smoker   . Smokeless tobacco: Not on file  . Alcohol Use: Yes     rare    REPRODUCTIVE HISTORY AND PERSONAL RISK ASSESSMENT FACTORS: Menarche was at age 23.   Menopause at 48 Uterus Intact: No, TAH in 1991 at age 60 Ovaries Intact: yes G2P2A0 , first live birth at age 25  She has not previously undergone treatment for infertility.   OCP use for 4-5 years   She has used HRT in the past.    FAMILY HISTORY:  We obtained a detailed, 4-generation family history.  Significant diagnoses are listed  below: Family History  Problem Relation Age of Onset  . Breast cancer Mother 44  . Esophageal cancer Father 73  . Breast cancer Sister 67  . Lung cancer Brother 9    smoker  . Pancreatic cancer Paternal Uncle   . Cervical cancer Maternal Grandmother   The patient's brother was diagnosed with lung cancer at age 108.  She has a sister who was diagnosed with breast cancer at age 85.  Four other brothers and sisters are healthy.  The patient's mother was diagnosed with breast cancer at age 7, and her maternal grandmother was diagnosed with cervical cancer and died at age 20.  A great aunt also had breast cancer at an unknown age. The patient's father was diagnosed with esophageal cancer age 55.  He had four brothers and three sisters.  One brother died of pancreatic cancer.  This brother has a daughter who had a brain tumor.  There is no other reported cancer history.  Patient's maternal ancestors are of [place] and [place] descent, and paternal ancestors are of [place] descent. There is no reported Ashkenazi Jewish ancestry. There is no  known consanguinity.  GENETIC COUNSELING RISK ASSESSMENT, DISCUSSION, AND SUGGESTED FOLLOW UP: We reviewed the natural history and genetic etiology of sporadic, familial and hereditary cancer syndromes.  About 5-10% of breast cancer is hereditary.  Of this, about 85% is the result of a BRCA1 or BRCA2 mutation.  We reviewed the red flags of hereditary cancer syndromes and the dominant inheritance patterns. At this time, the patient does not meet the medical criteria for BCBS.    The patient's family history is suggestive of the following possible diagnosis: sporadic vs. Familial breast cancer  In order to estimate her chance of having a BRCA1 or BRCA2 mutation, we used statistical models (Penn II) and laboratory data that take into account her personal medical history, family history and ancestry.  Because each model is different, there can be a lot of variability in  the risks they give.  Therefore, these numbers must be considered a rough range and not a precise risk of having a BRCA1 or BRCA2 mutation.  These models estimate that she has approximately a 0.5-3% chance of having a mutation. Based on this assessment of her family and personal history, genetic testing is not recommended.  Based on the patient's personal and family history, statistical models (tyrer cusik)  and literature data were used to estimate her risk of developing breast cancer. These estimate her lifetime risk of developing breast cancer to be approximately 16.4%. This estimation does not take into account any genetic testing results.   After considering the risks, benefits, and limitations, the patient did not undergo testing at this time.  The patient was seen for a total of 60 minutes, greater than 50% of which was spent face-to-face counseling.  This plan is being carried out per Dr. Kevan Ny recommendations.  This note will also be sent to the referring provider via the electronic medical record. The patient will be supplied with a summary of this genetic counseling discussion as well as educational information on the discussed hereditary cancer syndromes following the conclusion of their visit.   Patient was discussed with Dr. Drue Second.  _______________________________________________________________________ For Office Staff:  Number of people involved in session: 2 Was an Intern/ student involved with case: not applicable

## 2012-05-09 ENCOUNTER — Encounter: Payer: Self-pay | Admitting: Genetic Counselor

## 2012-06-25 ENCOUNTER — Ambulatory Visit (INDEPENDENT_AMBULATORY_CARE_PROVIDER_SITE_OTHER): Payer: BC Managed Care – PPO | Admitting: Licensed Clinical Social Worker

## 2012-06-25 DIAGNOSIS — F410 Panic disorder [episodic paroxysmal anxiety] without agoraphobia: Secondary | ICD-10-CM

## 2012-07-21 ENCOUNTER — Ambulatory Visit (INDEPENDENT_AMBULATORY_CARE_PROVIDER_SITE_OTHER): Payer: BC Managed Care – PPO | Admitting: Licensed Clinical Social Worker

## 2012-07-21 DIAGNOSIS — F410 Panic disorder [episodic paroxysmal anxiety] without agoraphobia: Secondary | ICD-10-CM

## 2012-08-20 ENCOUNTER — Ambulatory Visit: Payer: BC Managed Care – PPO | Admitting: Licensed Clinical Social Worker

## 2012-09-08 ENCOUNTER — Ambulatory Visit: Payer: BC Managed Care – PPO | Admitting: Licensed Clinical Social Worker

## 2013-01-16 ENCOUNTER — Ambulatory Visit
Admission: RE | Admit: 2013-01-16 | Discharge: 2013-01-16 | Disposition: A | Discharge status: Home / Self Care | Payer: BC Managed Care – PPO | Source: Ambulatory Visit

## 2013-01-16 ENCOUNTER — Other Ambulatory Visit: Payer: Self-pay

## 2013-01-16 DIAGNOSIS — K5900 Constipation, unspecified: Secondary | ICD-10-CM

## 2013-03-03 ENCOUNTER — Other Ambulatory Visit: Payer: Self-pay

## 2013-03-03 DIAGNOSIS — Z1231 Encounter for screening mammogram for malignant neoplasm of breast: Secondary | ICD-10-CM

## 2013-04-02 ENCOUNTER — Ambulatory Visit: Payer: BC Managed Care – PPO

## 2013-04-06 ENCOUNTER — Ambulatory Visit
Admission: RE | Admit: 2013-04-06 | Discharge: 2013-04-06 | Disposition: A | Discharge status: Home / Self Care | Payer: BC Managed Care – PPO | Source: Ambulatory Visit

## 2013-04-06 DIAGNOSIS — Z1231 Encounter for screening mammogram for malignant neoplasm of breast: Secondary | ICD-10-CM

## 2013-08-05 ENCOUNTER — Other Ambulatory Visit: Payer: Self-pay | Admitting: Dermatology

## 2013-12-02 DIAGNOSIS — K219 Gastro-esophageal reflux disease without esophagitis: Secondary | ICD-10-CM | POA: Diagnosis not present

## 2013-12-02 DIAGNOSIS — Z79899 Other long term (current) drug therapy: Secondary | ICD-10-CM | POA: Diagnosis not present

## 2013-12-03 DIAGNOSIS — Z961 Presence of intraocular lens: Secondary | ICD-10-CM | POA: Diagnosis not present

## 2013-12-04 ENCOUNTER — Ambulatory Visit (INDEPENDENT_AMBULATORY_CARE_PROVIDER_SITE_OTHER): Payer: Medicare Other | Admitting: Licensed Clinical Social Worker

## 2013-12-04 DIAGNOSIS — F410 Panic disorder [episodic paroxysmal anxiety] without agoraphobia: Secondary | ICD-10-CM

## 2013-12-07 ENCOUNTER — Other Ambulatory Visit (INDEPENDENT_AMBULATORY_CARE_PROVIDER_SITE_OTHER): Payer: Medicare Other

## 2013-12-07 DIAGNOSIS — F410 Panic disorder [episodic paroxysmal anxiety] without agoraphobia: Secondary | ICD-10-CM

## 2013-12-14 DIAGNOSIS — F411 Generalized anxiety disorder: Secondary | ICD-10-CM | POA: Diagnosis not present

## 2013-12-28 ENCOUNTER — Ambulatory Visit (INDEPENDENT_AMBULATORY_CARE_PROVIDER_SITE_OTHER): Payer: Medicare Other | Admitting: Licensed Clinical Social Worker

## 2013-12-28 DIAGNOSIS — F410 Panic disorder [episodic paroxysmal anxiety] without agoraphobia: Secondary | ICD-10-CM | POA: Diagnosis not present

## 2013-12-28 DIAGNOSIS — L4050 Arthropathic psoriasis, unspecified: Secondary | ICD-10-CM | POA: Diagnosis not present

## 2013-12-31 DIAGNOSIS — F411 Generalized anxiety disorder: Secondary | ICD-10-CM | POA: Diagnosis not present

## 2014-01-21 DIAGNOSIS — F411 Generalized anxiety disorder: Secondary | ICD-10-CM | POA: Diagnosis not present

## 2014-02-22 DIAGNOSIS — L4050 Arthropathic psoriasis, unspecified: Secondary | ICD-10-CM | POA: Diagnosis not present

## 2014-03-10 ENCOUNTER — Other Ambulatory Visit: Payer: Self-pay

## 2014-03-10 DIAGNOSIS — Z1231 Encounter for screening mammogram for malignant neoplasm of breast: Secondary | ICD-10-CM

## 2014-03-14 DIAGNOSIS — Z7689 Persons encountering health services in other specified circumstances: Secondary | ICD-10-CM | POA: Diagnosis not present

## 2014-04-08 ENCOUNTER — Ambulatory Visit
Admission: RE | Admit: 2014-04-08 | Discharge: 2014-04-08 | Disposition: A | Discharge status: Home / Self Care | Payer: Medicare Other | Source: Ambulatory Visit

## 2014-04-08 ENCOUNTER — Encounter (INDEPENDENT_AMBULATORY_CARE_PROVIDER_SITE_OTHER): Payer: Self-pay

## 2014-04-08 DIAGNOSIS — Z1231 Encounter for screening mammogram for malignant neoplasm of breast: Secondary | ICD-10-CM | POA: Diagnosis not present

## 2014-04-08 DIAGNOSIS — R748 Abnormal levels of other serum enzymes: Secondary | ICD-10-CM | POA: Diagnosis not present

## 2014-04-08 DIAGNOSIS — M2550 Pain in unspecified joint: Secondary | ICD-10-CM | POA: Diagnosis not present

## 2014-04-08 DIAGNOSIS — L4050 Arthropathic psoriasis, unspecified: Secondary | ICD-10-CM | POA: Diagnosis not present

## 2014-04-08 DIAGNOSIS — M159 Polyosteoarthritis, unspecified: Secondary | ICD-10-CM | POA: Diagnosis not present

## 2014-04-08 DIAGNOSIS — L408 Other psoriasis: Secondary | ICD-10-CM | POA: Diagnosis not present

## 2014-04-15 DIAGNOSIS — L4050 Arthropathic psoriasis, unspecified: Secondary | ICD-10-CM | POA: Diagnosis not present

## 2014-04-20 DIAGNOSIS — Z85828 Personal history of other malignant neoplasm of skin: Secondary | ICD-10-CM | POA: Diagnosis not present

## 2014-04-20 DIAGNOSIS — D239 Other benign neoplasm of skin, unspecified: Secondary | ICD-10-CM | POA: Diagnosis not present

## 2014-04-20 DIAGNOSIS — D2330 Other benign neoplasm of skin of unspecified part of face: Secondary | ICD-10-CM | POA: Diagnosis not present

## 2014-04-20 DIAGNOSIS — L821 Other seborrheic keratosis: Secondary | ICD-10-CM | POA: Diagnosis not present

## 2014-05-14 DIAGNOSIS — L259 Unspecified contact dermatitis, unspecified cause: Secondary | ICD-10-CM | POA: Diagnosis not present

## 2014-05-14 DIAGNOSIS — Z85828 Personal history of other malignant neoplasm of skin: Secondary | ICD-10-CM | POA: Diagnosis not present

## 2014-06-30 DIAGNOSIS — L4050 Arthropathic psoriasis, unspecified: Secondary | ICD-10-CM | POA: Diagnosis not present

## 2014-07-08 DIAGNOSIS — Z23 Encounter for immunization: Secondary | ICD-10-CM | POA: Diagnosis not present

## 2014-07-08 DIAGNOSIS — F419 Anxiety disorder, unspecified: Secondary | ICD-10-CM | POA: Diagnosis not present

## 2014-07-08 DIAGNOSIS — R7309 Other abnormal glucose: Secondary | ICD-10-CM | POA: Diagnosis not present

## 2014-07-08 DIAGNOSIS — R252 Cramp and spasm: Secondary | ICD-10-CM | POA: Diagnosis not present

## 2014-07-08 DIAGNOSIS — E559 Vitamin D deficiency, unspecified: Secondary | ICD-10-CM | POA: Diagnosis not present

## 2014-08-10 DIAGNOSIS — M150 Primary generalized (osteo)arthritis: Secondary | ICD-10-CM | POA: Diagnosis not present

## 2014-08-10 DIAGNOSIS — L4050 Arthropathic psoriasis, unspecified: Secondary | ICD-10-CM | POA: Diagnosis not present

## 2014-08-10 DIAGNOSIS — L401 Generalized pustular psoriasis: Secondary | ICD-10-CM | POA: Diagnosis not present

## 2014-08-16 DIAGNOSIS — L4050 Arthropathic psoriasis, unspecified: Secondary | ICD-10-CM | POA: Diagnosis not present

## 2014-09-10 DIAGNOSIS — L821 Other seborrheic keratosis: Secondary | ICD-10-CM | POA: Diagnosis not present

## 2014-09-10 DIAGNOSIS — L570 Actinic keratosis: Secondary | ICD-10-CM | POA: Diagnosis not present

## 2014-09-10 DIAGNOSIS — L720 Epidermal cyst: Secondary | ICD-10-CM | POA: Diagnosis not present

## 2014-09-10 DIAGNOSIS — Z85828 Personal history of other malignant neoplasm of skin: Secondary | ICD-10-CM | POA: Diagnosis not present

## 2014-09-10 DIAGNOSIS — D1801 Hemangioma of skin and subcutaneous tissue: Secondary | ICD-10-CM | POA: Diagnosis not present

## 2014-10-11 DIAGNOSIS — L4050 Arthropathic psoriasis, unspecified: Secondary | ICD-10-CM | POA: Diagnosis not present

## 2014-12-01 DIAGNOSIS — L4050 Arthropathic psoriasis, unspecified: Secondary | ICD-10-CM | POA: Diagnosis not present

## 2014-12-08 DIAGNOSIS — F419 Anxiety disorder, unspecified: Secondary | ICD-10-CM | POA: Diagnosis not present

## 2014-12-08 DIAGNOSIS — Z23 Encounter for immunization: Secondary | ICD-10-CM | POA: Diagnosis not present

## 2014-12-08 DIAGNOSIS — E780 Pure hypercholesterolemia: Secondary | ICD-10-CM | POA: Diagnosis not present

## 2014-12-08 DIAGNOSIS — Z0000 Encounter for general adult medical examination without abnormal findings: Secondary | ICD-10-CM | POA: Diagnosis not present

## 2014-12-08 DIAGNOSIS — E559 Vitamin D deficiency, unspecified: Secondary | ICD-10-CM | POA: Diagnosis not present

## 2014-12-08 DIAGNOSIS — K219 Gastro-esophageal reflux disease without esophagitis: Secondary | ICD-10-CM | POA: Diagnosis not present

## 2014-12-08 DIAGNOSIS — R7309 Other abnormal glucose: Secondary | ICD-10-CM | POA: Diagnosis not present

## 2014-12-14 DIAGNOSIS — M150 Primary generalized (osteo)arthritis: Secondary | ICD-10-CM | POA: Diagnosis not present

## 2014-12-14 DIAGNOSIS — L4050 Arthropathic psoriasis, unspecified: Secondary | ICD-10-CM | POA: Diagnosis not present

## 2014-12-14 DIAGNOSIS — L401 Generalized pustular psoriasis: Secondary | ICD-10-CM | POA: Diagnosis not present

## 2015-01-25 DIAGNOSIS — L4050 Arthropathic psoriasis, unspecified: Secondary | ICD-10-CM | POA: Diagnosis not present

## 2015-04-07 ENCOUNTER — Other Ambulatory Visit: Payer: Self-pay

## 2015-04-07 DIAGNOSIS — Z1231 Encounter for screening mammogram for malignant neoplasm of breast: Secondary | ICD-10-CM

## 2015-04-14 DIAGNOSIS — Z79899 Other long term (current) drug therapy: Secondary | ICD-10-CM | POA: Diagnosis not present

## 2015-04-14 DIAGNOSIS — L4050 Arthropathic psoriasis, unspecified: Secondary | ICD-10-CM | POA: Diagnosis not present

## 2015-04-14 DIAGNOSIS — M62838 Other muscle spasm: Secondary | ICD-10-CM | POA: Diagnosis not present

## 2015-04-14 DIAGNOSIS — M150 Primary generalized (osteo)arthritis: Secondary | ICD-10-CM | POA: Diagnosis not present

## 2015-04-14 DIAGNOSIS — L409 Psoriasis, unspecified: Secondary | ICD-10-CM | POA: Diagnosis not present

## 2015-04-15 DIAGNOSIS — L4050 Arthropathic psoriasis, unspecified: Secondary | ICD-10-CM | POA: Diagnosis not present

## 2015-04-25 DIAGNOSIS — F419 Anxiety disorder, unspecified: Secondary | ICD-10-CM | POA: Diagnosis not present

## 2015-05-12 DIAGNOSIS — L814 Other melanin hyperpigmentation: Secondary | ICD-10-CM | POA: Diagnosis not present

## 2015-05-12 DIAGNOSIS — D1801 Hemangioma of skin and subcutaneous tissue: Secondary | ICD-10-CM | POA: Diagnosis not present

## 2015-05-12 DIAGNOSIS — D225 Melanocytic nevi of trunk: Secondary | ICD-10-CM | POA: Diagnosis not present

## 2015-05-12 DIAGNOSIS — L821 Other seborrheic keratosis: Secondary | ICD-10-CM | POA: Diagnosis not present

## 2015-05-30 ENCOUNTER — Ambulatory Visit
Admission: RE | Admit: 2015-05-30 | Discharge: 2015-05-30 | Disposition: A | Discharge status: Home / Self Care | Payer: Medicare Other | Source: Ambulatory Visit

## 2015-05-30 DIAGNOSIS — Z1231 Encounter for screening mammogram for malignant neoplasm of breast: Secondary | ICD-10-CM

## 2015-06-02 DIAGNOSIS — F419 Anxiety disorder, unspecified: Secondary | ICD-10-CM | POA: Diagnosis not present

## 2015-06-02 DIAGNOSIS — R310 Gross hematuria: Secondary | ICD-10-CM | POA: Diagnosis not present

## 2015-06-02 DIAGNOSIS — Z23 Encounter for immunization: Secondary | ICD-10-CM | POA: Diagnosis not present

## 2015-06-02 DIAGNOSIS — R7309 Other abnormal glucose: Secondary | ICD-10-CM | POA: Diagnosis not present

## 2015-08-03 DIAGNOSIS — N952 Postmenopausal atrophic vaginitis: Secondary | ICD-10-CM | POA: Diagnosis not present

## 2015-08-03 DIAGNOSIS — R310 Gross hematuria: Secondary | ICD-10-CM | POA: Diagnosis not present

## 2015-09-12 DIAGNOSIS — L4050 Arthropathic psoriasis, unspecified: Secondary | ICD-10-CM | POA: Diagnosis not present

## 2015-09-12 DIAGNOSIS — Z79899 Other long term (current) drug therapy: Secondary | ICD-10-CM | POA: Diagnosis not present

## 2015-09-12 DIAGNOSIS — M150 Primary generalized (osteo)arthritis: Secondary | ICD-10-CM | POA: Diagnosis not present

## 2015-09-12 DIAGNOSIS — L409 Psoriasis, unspecified: Secondary | ICD-10-CM | POA: Diagnosis not present

## 2015-09-12 DIAGNOSIS — M62838 Other muscle spasm: Secondary | ICD-10-CM | POA: Diagnosis not present

## 2015-10-03 DIAGNOSIS — N952 Postmenopausal atrophic vaginitis: Secondary | ICD-10-CM | POA: Diagnosis not present

## 2015-10-03 DIAGNOSIS — N362 Urethral caruncle: Secondary | ICD-10-CM | POA: Diagnosis not present

## 2015-10-03 DIAGNOSIS — N816 Rectocele: Secondary | ICD-10-CM | POA: Diagnosis not present

## 2015-10-03 DIAGNOSIS — R310 Gross hematuria: Secondary | ICD-10-CM | POA: Diagnosis not present

## 2015-10-24 DIAGNOSIS — L570 Actinic keratosis: Secondary | ICD-10-CM | POA: Diagnosis not present

## 2015-10-24 DIAGNOSIS — D1801 Hemangioma of skin and subcutaneous tissue: Secondary | ICD-10-CM | POA: Diagnosis not present

## 2015-10-24 DIAGNOSIS — D2271 Melanocytic nevi of right lower limb, including hip: Secondary | ICD-10-CM | POA: Diagnosis not present

## 2015-10-24 DIAGNOSIS — Z85828 Personal history of other malignant neoplasm of skin: Secondary | ICD-10-CM | POA: Diagnosis not present

## 2015-10-24 DIAGNOSIS — D485 Neoplasm of uncertain behavior of skin: Secondary | ICD-10-CM | POA: Diagnosis not present

## 2015-10-24 DIAGNOSIS — L821 Other seborrheic keratosis: Secondary | ICD-10-CM | POA: Diagnosis not present

## 2015-10-24 DIAGNOSIS — H0264 Xanthelasma of left upper eyelid: Secondary | ICD-10-CM | POA: Diagnosis not present

## 2015-10-24 DIAGNOSIS — H0261 Xanthelasma of right upper eyelid: Secondary | ICD-10-CM | POA: Diagnosis not present

## 2015-10-24 DIAGNOSIS — L400 Psoriasis vulgaris: Secondary | ICD-10-CM | POA: Diagnosis not present

## 2015-10-28 DIAGNOSIS — K5900 Constipation, unspecified: Secondary | ICD-10-CM | POA: Diagnosis not present

## 2015-10-28 DIAGNOSIS — K219 Gastro-esophageal reflux disease without esophagitis: Secondary | ICD-10-CM | POA: Diagnosis not present

## 2015-10-28 DIAGNOSIS — Z1211 Encounter for screening for malignant neoplasm of colon: Secondary | ICD-10-CM | POA: Diagnosis not present

## 2015-11-01 DIAGNOSIS — Z1211 Encounter for screening for malignant neoplasm of colon: Secondary | ICD-10-CM | POA: Diagnosis not present

## 2015-11-04 ENCOUNTER — Other Ambulatory Visit: Payer: Self-pay | Admitting: Gastroenterology

## 2015-11-04 ENCOUNTER — Ambulatory Visit
Admission: RE | Admit: 2015-11-04 | Discharge: 2015-11-04 | Disposition: A | Discharge status: Home / Self Care | Payer: PPO | Source: Ambulatory Visit | Attending: Gastroenterology | Admitting: Gastroenterology

## 2015-11-04 DIAGNOSIS — R058 Other specified cough: Secondary | ICD-10-CM

## 2015-11-04 DIAGNOSIS — J690 Pneumonitis due to inhalation of food and vomit: Secondary | ICD-10-CM | POA: Diagnosis not present

## 2015-11-04 DIAGNOSIS — R05 Cough: Secondary | ICD-10-CM

## 2015-11-16 DIAGNOSIS — R351 Nocturia: Secondary | ICD-10-CM | POA: Diagnosis not present

## 2015-11-16 DIAGNOSIS — N816 Rectocele: Secondary | ICD-10-CM | POA: Diagnosis not present

## 2015-11-28 DIAGNOSIS — E875 Hyperkalemia: Secondary | ICD-10-CM | POA: Diagnosis not present

## 2015-11-28 DIAGNOSIS — Z79899 Other long term (current) drug therapy: Secondary | ICD-10-CM | POA: Diagnosis not present

## 2015-11-28 DIAGNOSIS — L409 Psoriasis, unspecified: Secondary | ICD-10-CM | POA: Diagnosis not present

## 2015-11-28 DIAGNOSIS — L4050 Arthropathic psoriasis, unspecified: Secondary | ICD-10-CM | POA: Diagnosis not present

## 2015-11-28 DIAGNOSIS — M62838 Other muscle spasm: Secondary | ICD-10-CM | POA: Diagnosis not present

## 2015-11-28 DIAGNOSIS — M150 Primary generalized (osteo)arthritis: Secondary | ICD-10-CM | POA: Diagnosis not present

## 2015-12-12 DIAGNOSIS — J301 Allergic rhinitis due to pollen: Secondary | ICD-10-CM | POA: Diagnosis not present

## 2015-12-12 DIAGNOSIS — E559 Vitamin D deficiency, unspecified: Secondary | ICD-10-CM | POA: Diagnosis not present

## 2015-12-12 DIAGNOSIS — F419 Anxiety disorder, unspecified: Secondary | ICD-10-CM | POA: Diagnosis not present

## 2015-12-12 DIAGNOSIS — E7800 Pure hypercholesterolemia, unspecified: Secondary | ICD-10-CM | POA: Diagnosis not present

## 2015-12-12 DIAGNOSIS — R7309 Other abnormal glucose: Secondary | ICD-10-CM | POA: Diagnosis not present

## 2015-12-12 DIAGNOSIS — Z0000 Encounter for general adult medical examination without abnormal findings: Secondary | ICD-10-CM | POA: Diagnosis not present

## 2015-12-12 DIAGNOSIS — L4050 Arthropathic psoriasis, unspecified: Secondary | ICD-10-CM | POA: Diagnosis not present

## 2015-12-30 DIAGNOSIS — N816 Rectocele: Secondary | ICD-10-CM | POA: Diagnosis not present

## 2015-12-30 DIAGNOSIS — R490 Dysphonia: Secondary | ICD-10-CM | POA: Diagnosis not present

## 2015-12-30 DIAGNOSIS — K5901 Slow transit constipation: Secondary | ICD-10-CM | POA: Diagnosis not present

## 2016-01-02 DIAGNOSIS — M545 Low back pain: Secondary | ICD-10-CM | POA: Diagnosis not present

## 2016-01-02 DIAGNOSIS — L409 Psoriasis, unspecified: Secondary | ICD-10-CM | POA: Diagnosis not present

## 2016-01-02 DIAGNOSIS — E875 Hyperkalemia: Secondary | ICD-10-CM | POA: Diagnosis not present

## 2016-01-02 DIAGNOSIS — Z79899 Other long term (current) drug therapy: Secondary | ICD-10-CM | POA: Diagnosis not present

## 2016-01-02 DIAGNOSIS — M62838 Other muscle spasm: Secondary | ICD-10-CM | POA: Diagnosis not present

## 2016-01-02 DIAGNOSIS — M150 Primary generalized (osteo)arthritis: Secondary | ICD-10-CM | POA: Diagnosis not present

## 2016-01-02 DIAGNOSIS — L4050 Arthropathic psoriasis, unspecified: Secondary | ICD-10-CM | POA: Diagnosis not present

## 2016-03-30 DIAGNOSIS — Z961 Presence of intraocular lens: Secondary | ICD-10-CM | POA: Diagnosis not present

## 2016-03-30 DIAGNOSIS — H1851 Endothelial corneal dystrophy: Secondary | ICD-10-CM | POA: Diagnosis not present

## 2016-03-30 DIAGNOSIS — H26491 Other secondary cataract, right eye: Secondary | ICD-10-CM | POA: Diagnosis not present

## 2016-03-30 DIAGNOSIS — H524 Presbyopia: Secondary | ICD-10-CM | POA: Diagnosis not present

## 2016-04-10 DIAGNOSIS — L409 Psoriasis, unspecified: Secondary | ICD-10-CM | POA: Diagnosis not present

## 2016-04-10 DIAGNOSIS — E875 Hyperkalemia: Secondary | ICD-10-CM | POA: Diagnosis not present

## 2016-04-10 DIAGNOSIS — Z79899 Other long term (current) drug therapy: Secondary | ICD-10-CM | POA: Diagnosis not present

## 2016-04-10 DIAGNOSIS — M545 Low back pain: Secondary | ICD-10-CM | POA: Diagnosis not present

## 2016-04-10 DIAGNOSIS — M150 Primary generalized (osteo)arthritis: Secondary | ICD-10-CM | POA: Diagnosis not present

## 2016-04-10 DIAGNOSIS — Z13820 Encounter for screening for osteoporosis: Secondary | ICD-10-CM | POA: Diagnosis not present

## 2016-04-10 DIAGNOSIS — L4050 Arthropathic psoriasis, unspecified: Secondary | ICD-10-CM | POA: Diagnosis not present

## 2016-04-13 ENCOUNTER — Other Ambulatory Visit: Payer: Self-pay | Admitting: Physician Assistant

## 2016-04-13 DIAGNOSIS — R5381 Other malaise: Secondary | ICD-10-CM

## 2016-04-18 ENCOUNTER — Other Ambulatory Visit: Payer: Self-pay | Admitting: Physician Assistant

## 2016-04-18 DIAGNOSIS — M8580 Other specified disorders of bone density and structure, unspecified site: Secondary | ICD-10-CM

## 2016-04-24 DIAGNOSIS — H26491 Other secondary cataract, right eye: Secondary | ICD-10-CM | POA: Diagnosis not present

## 2016-04-25 ENCOUNTER — Ambulatory Visit
Admission: RE | Admit: 2016-04-25 | Discharge: 2016-04-25 | Disposition: A | Discharge status: Home / Self Care | Payer: PPO | Source: Ambulatory Visit | Attending: Physician Assistant | Admitting: Physician Assistant

## 2016-04-25 DIAGNOSIS — M8589 Other specified disorders of bone density and structure, multiple sites: Secondary | ICD-10-CM | POA: Diagnosis not present

## 2016-04-25 DIAGNOSIS — M8580 Other specified disorders of bone density and structure, unspecified site: Secondary | ICD-10-CM

## 2016-04-25 DIAGNOSIS — Z780 Asymptomatic menopausal state: Secondary | ICD-10-CM | POA: Diagnosis not present

## 2016-06-13 DIAGNOSIS — R7303 Prediabetes: Secondary | ICD-10-CM | POA: Diagnosis not present

## 2016-06-13 DIAGNOSIS — F419 Anxiety disorder, unspecified: Secondary | ICD-10-CM | POA: Diagnosis not present

## 2016-06-13 DIAGNOSIS — R7309 Other abnormal glucose: Secondary | ICD-10-CM | POA: Diagnosis not present

## 2016-06-13 DIAGNOSIS — E559 Vitamin D deficiency, unspecified: Secondary | ICD-10-CM | POA: Diagnosis not present

## 2016-06-13 DIAGNOSIS — Z23 Encounter for immunization: Secondary | ICD-10-CM | POA: Diagnosis not present

## 2016-07-11 DIAGNOSIS — M150 Primary generalized (osteo)arthritis: Secondary | ICD-10-CM | POA: Diagnosis not present

## 2016-07-11 DIAGNOSIS — M545 Low back pain: Secondary | ICD-10-CM | POA: Diagnosis not present

## 2016-07-11 DIAGNOSIS — M8589 Other specified disorders of bone density and structure, multiple sites: Secondary | ICD-10-CM | POA: Diagnosis not present

## 2016-07-11 DIAGNOSIS — L409 Psoriasis, unspecified: Secondary | ICD-10-CM | POA: Diagnosis not present

## 2016-07-11 DIAGNOSIS — L4050 Arthropathic psoriasis, unspecified: Secondary | ICD-10-CM | POA: Diagnosis not present

## 2016-07-11 DIAGNOSIS — Z79899 Other long term (current) drug therapy: Secondary | ICD-10-CM | POA: Diagnosis not present

## 2016-07-30 DIAGNOSIS — F419 Anxiety disorder, unspecified: Secondary | ICD-10-CM | POA: Diagnosis not present

## 2016-07-31 ENCOUNTER — Other Ambulatory Visit: Payer: Self-pay | Admitting: Family Medicine

## 2016-07-31 DIAGNOSIS — Z1231 Encounter for screening mammogram for malignant neoplasm of breast: Secondary | ICD-10-CM

## 2016-08-06 ENCOUNTER — Ambulatory Visit
Admission: RE | Admit: 2016-08-06 | Discharge: 2016-08-06 | Disposition: A | Discharge status: Home / Self Care | Payer: PPO | Source: Ambulatory Visit | Attending: Family Medicine | Admitting: Family Medicine

## 2016-08-06 DIAGNOSIS — Z1231 Encounter for screening mammogram for malignant neoplasm of breast: Secondary | ICD-10-CM | POA: Diagnosis not present

## 2016-08-29 DIAGNOSIS — H43812 Vitreous degeneration, left eye: Secondary | ICD-10-CM | POA: Diagnosis not present

## 2016-08-29 DIAGNOSIS — S0592XA Unspecified injury of left eye and orbit, initial encounter: Secondary | ICD-10-CM | POA: Diagnosis not present

## 2016-10-03 ENCOUNTER — Other Ambulatory Visit: Payer: PPO

## 2016-10-03 ENCOUNTER — Encounter: Payer: Self-pay | Admitting: Genetic Counselor

## 2016-10-03 ENCOUNTER — Ambulatory Visit (HOSPITAL_BASED_OUTPATIENT_CLINIC_OR_DEPARTMENT_OTHER): Payer: PPO | Admitting: Genetic Counselor

## 2016-10-03 DIAGNOSIS — Z803 Family history of malignant neoplasm of breast: Secondary | ICD-10-CM

## 2016-10-03 DIAGNOSIS — Z315 Encounter for genetic counseling: Secondary | ICD-10-CM

## 2016-10-03 DIAGNOSIS — Z8042 Family history of malignant neoplasm of prostate: Secondary | ICD-10-CM | POA: Diagnosis not present

## 2016-10-03 DIAGNOSIS — Z8481 Family history of carrier of genetic disease: Secondary | ICD-10-CM | POA: Diagnosis not present

## 2016-10-03 NOTE — Progress Notes (Signed)
REFERRING PROVIDER: No referring provider defined for this encounter.  PRIMARY PROVIDER:  No primary care provider on file.  PRIMARY REASON FOR VISIT:  1. Family history of breast cancer   2. Family history of prostate cancer   3. Family history of BRCA1 gene positive      HISTORY OF PRESENT ILLNESS:   Catherine Rojas, a 66 y.o. female, was seen for a  cancer genetics consultation at the request of Dr. No ref. provider found due to a family history of cancer.  Catherine Rojas presents to clinic today to discuss the possibility of a hereditary predisposition to cancer, genetic testing, and to further clarify her future cancer risks, as well as potential cancer risks for family members. Catherine Rojas is a 66 y.o. female with no personal history of cancer.  Catherine Rojas sister underwent genetic testing after being diagnosed with 3 different cancers.  She was found to carry a BRCA1 mutation.  CANCER HISTORY:   No history exists.     HORMONAL RISK FACTORS:  Menarche was at age 15.  First live birth at age 52.  OCP use for approximately 4-5\ years.  Ovaries intact: yes.  Hysterectomy: yes.  Menopausal status: postmenopausal.  HRT use: 12 years. Colonoscopy: yes; normal. Mammogram within the last year: yes. Number of breast biopsies: 0. Up to date with pelvic exams:  yes. Any excessive radiation exposure in the past:  no  Past Medical History:  Diagnosis Date  . Anxiety   . Arthritis   . Complication of anesthesia    n/v  . Depression   . Diverticular disease of colon   . Family history of breast cancer   . Family history of prostate cancer   . GERD (gastroesophageal reflux disease)   . PONV (postoperative nausea and vomiting)   . Seasonal allergies     Past Surgical History:  Procedure Laterality Date  . ABDOMINAL HYSTERECTOMY  1991  . CARPAL TUNNEL RELEASE  12/20/2011   Procedure: CARPAL TUNNEL RELEASE;  Surgeon: Cammie Sickle., MD;  Location: [place];  Service: Orthopedics;  Laterality: Left;  . CARPAL TUNNEL RELEASE  01/17/2012   Procedure: CARPAL TUNNEL RELEASE;  Surgeon: Cammie Sickle., MD;  Location: [place];  Service: Orthopedics;  Laterality: Right;  . COLONOSCOPY    . EYE SURGERY     both cataracts-lense implants  . FINGER ARTHROPLASTY  2007   lt small finger graft  . TONSILLECTOMY    . UPPER GASTROINTESTINAL ENDOSCOPY      Social History   Social History  . Marital status: Married    Spouse name: N/A  . Number of children: N/A  . Years of education: N/A   Social History Main Topics  . Smoking status: Never Smoker  . Smokeless tobacco: Never Used  . Alcohol use Yes     Comment: rare  . Drug use: No  . Sexual activity: Not Asked   Other Topics Concern  . None   Social History Narrative  . None     FAMILY HISTORY:  We obtained a detailed, 4-generation family history.  Significant diagnoses are listed below: Family History  Problem Relation Age of Onset  . Breast cancer Mother 39  . Esophageal cancer Father 76  . Breast cancer Sister 57  . BRCA 1/2 Sister     BRCA1 pos  . Lung cancer Brother 61    smoker  . Pancreatic cancer Paternal Uncle   .  Cervical cancer Maternal Grandmother   . Prostate cancer Brother     The patient has two sons who are cancer free.  She has three brothers and three sisters.  One brother was diagnosed with lung cancer at age 28 and another brother was diagnosed with prostate cancer at 88.  She has a sister who was diagnosed with breast cancer at age 81.  This sister has also been diagnosed with thyroid cancer and bladder cancer and was found to carry a BRCA1 mutation.  Another sister was found to have a pituitary tumor.  The patient's mother was diagnosed with breast cancer at age 2, and her maternal grandmother was diagnosed with cervical cancer and died at age 3.  A great aunt also had breast cancer at an unknown age. The patient's father was diagnosed  with esophageal cancer age 57.  He had four brothers and three sisters.  One brother died of pancreatic cancer.  This brother has a daughter who had a brain tumor.  There is no other reported cancer history.  Patient's maternal ancestors are of [place] and [place] descent, and paternal ancestors are of [place] descent. There is no reported Ashkenazi Jewish ancestry. There is no  known consanguinity.  GENETIC COUNSELING ASSESSMENT: Catherine Rojas is a 66 y.o. 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 We, therefore, discussed and recommended the following at today's visit.   DISCUSSION: We discussed that about 5-10% of breast cancer is due to hereditary causes, most commonly BRCA mutations.  BRCA1 increases the risk for breast, ovarian, prostate cancer.  The patient has a 50% chance of testing positive for the mutation found in her family.  We reviewed the characteristics, features and inheritance patterns of hereditary cancer syndromes. We also discussed genetic testing, including the appropriate family members to test, the process of testing, insurance coverage and turn-around-time for results. We discussed the implications of a negative, positive and/or variant of uncertain significant result. We recommended Catherine Rojas pursue genetic testing for the BRCA1 gene.   Catherine Rojas's sister was tested through [facility]. Catherine Rojas is within 90 days of her sister's testing and therefore testing should be complementary.  PLAN: After considering the risks, benefits, and limitations, Catherine Rojas  provided informed consent to pursue genetic testing and the blood sample was sent to [facility] for analysis of the BRCA1 gene. Results should be available within approximately 2-3 weeks' time, at which point they will be disclosed by telephone to Catherine Rojas, as will any additional  recommendations warranted by these results. Catherine Rojas will receive a summary of her genetic counseling visit and a copy of her results once available. This information will also be available in Epic. We encouraged Catherine Rojas to remain in contact with cancer genetics annually so that we can continuously update the family history and inform her of any changes in cancer genetics and testing that may be of benefit for her family. Catherine Rojas questions were answered to her satisfaction today. Our contact information was provided should additional questions or concerns arise.  Lastly, we encouraged Catherine Rojas to remain in contact with cancer genetics annually so that we can continuously update the family history and inform her of any changes in cancer genetics and testing that may be of benefit for this family.   Ms.  Rojas questions were answered to her satisfaction today. Our contact information  was provided should additional questions or concerns arise. Thank you for the referral and allowing [place] to share in the care of your patient.   Karen P. Florene Glen, [place], [facility] Certified Genetic Counselor [email or web address] phone: [phone]  The patient was seen for a total of 30 minutes in face-to-face genetic counseling.  This patient was discussed with Drs. Magrinat, Lindi Adie and/or Burr Medico who agrees with the above.    _______________________________________________________________________ For Office Staff:  Number of people involved in session: 1 Was an Intern/ student involved with case: no

## 2016-10-18 ENCOUNTER — Ambulatory Visit: Payer: Self-pay | Admitting: Genetic Counselor

## 2016-10-18 ENCOUNTER — Encounter: Payer: Self-pay | Admitting: Genetic Counselor

## 2016-10-18 ENCOUNTER — Telehealth: Payer: Self-pay | Admitting: Genetic Counselor

## 2016-10-18 DIAGNOSIS — Z803 Family history of malignant neoplasm of breast: Secondary | ICD-10-CM

## 2016-10-18 DIAGNOSIS — Z1379 Encounter for other screening for genetic and chromosomal anomalies: Secondary | ICD-10-CM | POA: Insufficient documentation

## 2016-10-18 DIAGNOSIS — Z8042 Family history of malignant neoplasm of prostate: Secondary | ICD-10-CM

## 2016-10-18 DIAGNOSIS — Z8481 Family history of carrier of genetic disease: Secondary | ICD-10-CM

## 2016-10-18 NOTE — Telephone Encounter (Signed)
LM On VM with good news.  Asked that she CB.

## 2016-10-18 NOTE — Progress Notes (Signed)
HPI: [name] was previously seen in the [facility] due to a family history of cancer, a known BRCA1 mutation in the family, and concerns regarding a hereditary predisposition to cancer. Please refer to our prior cancer genetics clinic note for more information regarding [name]'s medical, social and family histories, and our assessment and recommendations, at the time. [name] recent genetic test results were disclosed to her, as were recommendations warranted by these results. These results and recommendations are discussed in more detail below.  CANCER HISTORY:   No history exists.    FAMILY HISTORY:  We obtained a detailed, 4-generation family history.  Significant diagnoses are listed below: Family History  Problem Relation Age of Onset  . Breast cancer Mother 63  . Esophageal cancer Father 52  . Breast cancer Sister 57  . BRCA 1/2 Sister     BRCA1 pos  . Lung cancer Brother 12    smoker  . Pancreatic cancer Paternal Uncle   . Cervical cancer Maternal Grandmother   . Prostate cancer Brother     The patient has two sons who are cancer free.  She has three brothers and three sisters.  One brother was diagnosed with lung cancer at age 42 and another brother was diagnosed with prostate cancer at 1. She has a sister who was diagnosed with breast cancer at age 76.  This sister has also been diagnosed with thyroid cancer and bladder cancer and was found to carry a BRCA1 mutation.  Another sister was found to have a pituitary tumor. The patient's mother was diagnosed with breast cancer at age 51, and her maternal grandmother was diagnosed with cervical cancer and died at age 16. A great aunt also had breast cancer at an unknown age. The patient's father was diagnosed with esophageal cancer age 68. He had four brothers and three sisters. One brother died of pancreatic cancer. This brother has a daughter who had a brain tumor. There is no other reported  cancer history.  Patient's maternal ancestors are of [place] and [place], and paternal ancestors are of [place]. There is noreported Ashkenazi Jewish ancestry. There is noknown consanguinity.  GENETIC TEST RESULTS: We recommended [name] pursue testing for the familial hereditary cancer gene mutation called BRCA1 [id number]. [name]'s test was normal and did not reveal the familial mutation. We call this result a true negative result because the cancer-causing mutation was identified in [name]'s family, and she did not inherit it.  Given this negative result, [name]'s chances of developing BRCA1-related cancers are the same as they are in the general population.    CANCER SCREENING RECOMMENDATIONS:  This normal result is reassuring and indicates that [name] does not likely have an increased risk of cancer due to a mutation in one of these genes.  We, therefore, recommended  [name] continue to follow the cancer screening guidelines provided by her primary healthcare providers.   RECOMMENDATIONS FOR FAMILY MEMBERS: Women in the extended family might be at some increased risk of developing cancer, over the general population risk, simply due to the family history of cancer and possibly inheriting the known family mutation. However, [name]'s children are not at risk for having inherited this gene mutation.  We recommended women in this family have a yearly mammogram beginning at age 80, or 3 years younger than the earliest onset of cancer, an annual clinical breast exam, and perform monthly breast self-exams. Women in this family should also have  a gynecological exam as recommended by their primary provider. All family members should have a colonoscopy by age 68.  FOLLOW-UP: Lastly, we discussed with [name] that cancer genetics is a rapidly advancing field and it is possible that new genetic tests will be appropriate for her and/or her family members in the  future. We encouraged her to remain in contact with cancer genetics on an annual basis so we can update her personal and family histories and let her know of advances in cancer genetics that may benefit this family.   Our contact number was provided. [name] questions were answered to her satisfaction, and she knows she is welcome to call us at anytime with additional questions or concerns.   Roma Kayser, MS, [facility] Certified Genetic Counselor [email or web address]

## 2016-10-18 NOTE — Telephone Encounter (Signed)
Revealed that patient was negative for the BRCA1 mutation found in her sister.  Discussed that her sons are not at risk for having inherited a BRCA1 mutation , unless there was a concern for this on their dad's side of the family.  I released test results to the patient via the online portal.

## 2016-10-30 DIAGNOSIS — M545 Low back pain: Secondary | ICD-10-CM | POA: Diagnosis not present

## 2016-10-30 DIAGNOSIS — L409 Psoriasis, unspecified: Secondary | ICD-10-CM | POA: Diagnosis not present

## 2016-10-30 DIAGNOSIS — Z6830 Body mass index (BMI) 30.0-30.9, adult: Secondary | ICD-10-CM | POA: Diagnosis not present

## 2016-10-30 DIAGNOSIS — M8589 Other specified disorders of bone density and structure, multiple sites: Secondary | ICD-10-CM | POA: Diagnosis not present

## 2016-10-30 DIAGNOSIS — L4050 Arthropathic psoriasis, unspecified: Secondary | ICD-10-CM | POA: Diagnosis not present

## 2016-10-30 DIAGNOSIS — Z79899 Other long term (current) drug therapy: Secondary | ICD-10-CM | POA: Diagnosis not present

## 2016-10-30 DIAGNOSIS — E669 Obesity, unspecified: Secondary | ICD-10-CM | POA: Diagnosis not present

## 2016-10-30 DIAGNOSIS — M150 Primary generalized (osteo)arthritis: Secondary | ICD-10-CM | POA: Diagnosis not present

## 2016-12-05 ENCOUNTER — Encounter (HOSPITAL_COMMUNITY): Payer: Self-pay

## 2016-12-25 DIAGNOSIS — Z85828 Personal history of other malignant neoplasm of skin: Secondary | ICD-10-CM | POA: Diagnosis not present

## 2016-12-25 DIAGNOSIS — L820 Inflamed seborrheic keratosis: Secondary | ICD-10-CM | POA: Diagnosis not present

## 2016-12-25 DIAGNOSIS — D2272 Melanocytic nevi of left lower limb, including hip: Secondary | ICD-10-CM | POA: Diagnosis not present

## 2016-12-25 DIAGNOSIS — D225 Melanocytic nevi of trunk: Secondary | ICD-10-CM | POA: Diagnosis not present

## 2016-12-25 DIAGNOSIS — L821 Other seborrheic keratosis: Secondary | ICD-10-CM | POA: Diagnosis not present

## 2016-12-25 DIAGNOSIS — D1801 Hemangioma of skin and subcutaneous tissue: Secondary | ICD-10-CM | POA: Diagnosis not present

## 2017-01-10 DIAGNOSIS — R7303 Prediabetes: Secondary | ICD-10-CM | POA: Diagnosis not present

## 2017-01-10 DIAGNOSIS — K219 Gastro-esophageal reflux disease without esophagitis: Secondary | ICD-10-CM | POA: Diagnosis not present

## 2017-01-10 DIAGNOSIS — F419 Anxiety disorder, unspecified: Secondary | ICD-10-CM | POA: Diagnosis not present

## 2017-01-10 DIAGNOSIS — E559 Vitamin D deficiency, unspecified: Secondary | ICD-10-CM | POA: Diagnosis not present

## 2017-01-29 DIAGNOSIS — L4050 Arthropathic psoriasis, unspecified: Secondary | ICD-10-CM | POA: Diagnosis not present

## 2017-02-11 DIAGNOSIS — L290 Pruritus ani: Secondary | ICD-10-CM | POA: Diagnosis not present

## 2017-02-11 DIAGNOSIS — K644 Residual hemorrhoidal skin tags: Secondary | ICD-10-CM | POA: Diagnosis not present

## 2017-05-02 DIAGNOSIS — M8589 Other specified disorders of bone density and structure, multiple sites: Secondary | ICD-10-CM | POA: Diagnosis not present

## 2017-05-02 DIAGNOSIS — M150 Primary generalized (osteo)arthritis: Secondary | ICD-10-CM | POA: Diagnosis not present

## 2017-05-02 DIAGNOSIS — Z79899 Other long term (current) drug therapy: Secondary | ICD-10-CM | POA: Diagnosis not present

## 2017-05-02 DIAGNOSIS — Z6830 Body mass index (BMI) 30.0-30.9, adult: Secondary | ICD-10-CM | POA: Diagnosis not present

## 2017-05-02 DIAGNOSIS — E669 Obesity, unspecified: Secondary | ICD-10-CM | POA: Diagnosis not present

## 2017-05-02 DIAGNOSIS — L4050 Arthropathic psoriasis, unspecified: Secondary | ICD-10-CM | POA: Diagnosis not present

## 2017-05-02 DIAGNOSIS — M545 Low back pain: Secondary | ICD-10-CM | POA: Diagnosis not present

## 2017-05-02 DIAGNOSIS — L409 Psoriasis, unspecified: Secondary | ICD-10-CM | POA: Diagnosis not present

## 2017-05-09 DIAGNOSIS — H1851 Endothelial corneal dystrophy: Secondary | ICD-10-CM | POA: Diagnosis not present

## 2017-05-09 DIAGNOSIS — Z961 Presence of intraocular lens: Secondary | ICD-10-CM | POA: Diagnosis not present

## 2017-05-09 DIAGNOSIS — H52203 Unspecified astigmatism, bilateral: Secondary | ICD-10-CM | POA: Diagnosis not present

## 2017-06-03 DIAGNOSIS — F419 Anxiety disorder, unspecified: Secondary | ICD-10-CM | POA: Diagnosis not present

## 2017-06-03 DIAGNOSIS — Z23 Encounter for immunization: Secondary | ICD-10-CM | POA: Diagnosis not present

## 2017-06-04 DIAGNOSIS — L4050 Arthropathic psoriasis, unspecified: Secondary | ICD-10-CM | POA: Diagnosis not present

## 2017-07-24 ENCOUNTER — Other Ambulatory Visit: Payer: Self-pay | Admitting: Family Medicine

## 2017-07-24 DIAGNOSIS — E559 Vitamin D deficiency, unspecified: Secondary | ICD-10-CM | POA: Diagnosis not present

## 2017-07-24 DIAGNOSIS — F419 Anxiety disorder, unspecified: Secondary | ICD-10-CM | POA: Diagnosis not present

## 2017-07-24 DIAGNOSIS — L4050 Arthropathic psoriasis, unspecified: Secondary | ICD-10-CM | POA: Diagnosis not present

## 2017-07-24 DIAGNOSIS — J301 Allergic rhinitis due to pollen: Secondary | ICD-10-CM | POA: Diagnosis not present

## 2017-07-24 DIAGNOSIS — Z0000 Encounter for general adult medical examination without abnormal findings: Secondary | ICD-10-CM | POA: Diagnosis not present

## 2017-07-24 DIAGNOSIS — R739 Hyperglycemia, unspecified: Secondary | ICD-10-CM | POA: Diagnosis not present

## 2017-07-24 DIAGNOSIS — E2839 Other primary ovarian failure: Secondary | ICD-10-CM

## 2017-07-24 DIAGNOSIS — Z1159 Encounter for screening for other viral diseases: Secondary | ICD-10-CM | POA: Diagnosis not present

## 2017-07-24 DIAGNOSIS — K219 Gastro-esophageal reflux disease without esophagitis: Secondary | ICD-10-CM | POA: Diagnosis not present

## 2017-08-02 DIAGNOSIS — M8589 Other specified disorders of bone density and structure, multiple sites: Secondary | ICD-10-CM | POA: Diagnosis not present

## 2017-08-02 DIAGNOSIS — L4050 Arthropathic psoriasis, unspecified: Secondary | ICD-10-CM | POA: Diagnosis not present

## 2017-08-02 DIAGNOSIS — M545 Low back pain: Secondary | ICD-10-CM | POA: Diagnosis not present

## 2017-08-02 DIAGNOSIS — L409 Psoriasis, unspecified: Secondary | ICD-10-CM | POA: Diagnosis not present

## 2017-08-02 DIAGNOSIS — Z6830 Body mass index (BMI) 30.0-30.9, adult: Secondary | ICD-10-CM | POA: Diagnosis not present

## 2017-08-02 DIAGNOSIS — E669 Obesity, unspecified: Secondary | ICD-10-CM | POA: Diagnosis not present

## 2017-08-02 DIAGNOSIS — Z79899 Other long term (current) drug therapy: Secondary | ICD-10-CM | POA: Diagnosis not present

## 2017-08-02 DIAGNOSIS — M150 Primary generalized (osteo)arthritis: Secondary | ICD-10-CM | POA: Diagnosis not present

## 2017-09-25 ENCOUNTER — Other Ambulatory Visit: Payer: Self-pay | Admitting: Family Medicine

## 2017-09-25 DIAGNOSIS — Z1231 Encounter for screening mammogram for malignant neoplasm of breast: Secondary | ICD-10-CM

## 2017-10-16 ENCOUNTER — Ambulatory Visit
Admission: RE | Admit: 2017-10-16 | Discharge: 2017-10-16 | Disposition: A | Discharge status: Home / Self Care | Payer: PPO | Source: Ambulatory Visit | Attending: Family Medicine | Admitting: Family Medicine

## 2017-10-16 DIAGNOSIS — Z1231 Encounter for screening mammogram for malignant neoplasm of breast: Secondary | ICD-10-CM | POA: Diagnosis not present

## 2017-10-31 DIAGNOSIS — Z79899 Other long term (current) drug therapy: Secondary | ICD-10-CM | POA: Diagnosis not present

## 2017-10-31 DIAGNOSIS — M150 Primary generalized (osteo)arthritis: Secondary | ICD-10-CM | POA: Diagnosis not present

## 2017-10-31 DIAGNOSIS — M8589 Other specified disorders of bone density and structure, multiple sites: Secondary | ICD-10-CM | POA: Diagnosis not present

## 2017-10-31 DIAGNOSIS — Z6830 Body mass index (BMI) 30.0-30.9, adult: Secondary | ICD-10-CM | POA: Diagnosis not present

## 2017-10-31 DIAGNOSIS — L409 Psoriasis, unspecified: Secondary | ICD-10-CM | POA: Diagnosis not present

## 2017-10-31 DIAGNOSIS — L4050 Arthropathic psoriasis, unspecified: Secondary | ICD-10-CM | POA: Diagnosis not present

## 2017-10-31 DIAGNOSIS — M545 Low back pain: Secondary | ICD-10-CM | POA: Diagnosis not present

## 2018-01-20 DIAGNOSIS — R7303 Prediabetes: Secondary | ICD-10-CM | POA: Diagnosis not present

## 2018-01-20 DIAGNOSIS — F419 Anxiety disorder, unspecified: Secondary | ICD-10-CM | POA: Diagnosis not present

## 2018-01-29 DIAGNOSIS — E663 Overweight: Secondary | ICD-10-CM | POA: Diagnosis not present

## 2018-01-29 DIAGNOSIS — M8589 Other specified disorders of bone density and structure, multiple sites: Secondary | ICD-10-CM | POA: Diagnosis not present

## 2018-01-29 DIAGNOSIS — Z6829 Body mass index (BMI) 29.0-29.9, adult: Secondary | ICD-10-CM | POA: Diagnosis not present

## 2018-01-29 DIAGNOSIS — M150 Primary generalized (osteo)arthritis: Secondary | ICD-10-CM | POA: Diagnosis not present

## 2018-01-29 DIAGNOSIS — Z79899 Other long term (current) drug therapy: Secondary | ICD-10-CM | POA: Diagnosis not present

## 2018-01-29 DIAGNOSIS — L4050 Arthropathic psoriasis, unspecified: Secondary | ICD-10-CM | POA: Diagnosis not present

## 2018-01-29 DIAGNOSIS — M545 Low back pain: Secondary | ICD-10-CM | POA: Diagnosis not present

## 2018-01-29 DIAGNOSIS — L409 Psoriasis, unspecified: Secondary | ICD-10-CM | POA: Diagnosis not present

## 2018-01-29 DIAGNOSIS — M7989 Other specified soft tissue disorders: Secondary | ICD-10-CM | POA: Diagnosis not present

## 2018-02-05 DIAGNOSIS — L570 Actinic keratosis: Secondary | ICD-10-CM | POA: Diagnosis not present

## 2018-02-05 DIAGNOSIS — L723 Sebaceous cyst: Secondary | ICD-10-CM | POA: Diagnosis not present

## 2018-02-05 DIAGNOSIS — D485 Neoplasm of uncertain behavior of skin: Secondary | ICD-10-CM | POA: Diagnosis not present

## 2018-02-05 DIAGNOSIS — Z85828 Personal history of other malignant neoplasm of skin: Secondary | ICD-10-CM | POA: Diagnosis not present

## 2018-02-05 DIAGNOSIS — D1801 Hemangioma of skin and subcutaneous tissue: Secondary | ICD-10-CM | POA: Diagnosis not present

## 2018-02-05 DIAGNOSIS — L820 Inflamed seborrheic keratosis: Secondary | ICD-10-CM | POA: Diagnosis not present

## 2018-06-03 ENCOUNTER — Other Ambulatory Visit: Payer: PPO

## 2018-06-04 DIAGNOSIS — M7989 Other specified soft tissue disorders: Secondary | ICD-10-CM | POA: Diagnosis not present

## 2018-06-04 DIAGNOSIS — Z6829 Body mass index (BMI) 29.0-29.9, adult: Secondary | ICD-10-CM | POA: Diagnosis not present

## 2018-06-04 DIAGNOSIS — L4050 Arthropathic psoriasis, unspecified: Secondary | ICD-10-CM | POA: Diagnosis not present

## 2018-06-04 DIAGNOSIS — Z23 Encounter for immunization: Secondary | ICD-10-CM | POA: Diagnosis not present

## 2018-06-04 DIAGNOSIS — L409 Psoriasis, unspecified: Secondary | ICD-10-CM | POA: Diagnosis not present

## 2018-06-04 DIAGNOSIS — E663 Overweight: Secondary | ICD-10-CM | POA: Diagnosis not present

## 2018-06-04 DIAGNOSIS — M545 Low back pain: Secondary | ICD-10-CM | POA: Diagnosis not present

## 2018-06-04 DIAGNOSIS — Z79899 Other long term (current) drug therapy: Secondary | ICD-10-CM | POA: Diagnosis not present

## 2018-06-04 DIAGNOSIS — M8589 Other specified disorders of bone density and structure, multiple sites: Secondary | ICD-10-CM | POA: Diagnosis not present

## 2018-06-04 DIAGNOSIS — M150 Primary generalized (osteo)arthritis: Secondary | ICD-10-CM | POA: Diagnosis not present

## 2018-06-12 DIAGNOSIS — R300 Dysuria: Secondary | ICD-10-CM | POA: Diagnosis not present

## 2018-06-12 DIAGNOSIS — R350 Frequency of micturition: Secondary | ICD-10-CM | POA: Diagnosis not present

## 2018-07-07 DIAGNOSIS — F419 Anxiety disorder, unspecified: Secondary | ICD-10-CM | POA: Diagnosis not present

## 2018-07-23 ENCOUNTER — Other Ambulatory Visit: Payer: PPO

## 2018-08-28 DIAGNOSIS — N760 Acute vaginitis: Secondary | ICD-10-CM | POA: Diagnosis not present

## 2018-08-28 DIAGNOSIS — N898 Other specified noninflammatory disorders of vagina: Secondary | ICD-10-CM | POA: Diagnosis not present

## 2018-09-24 DIAGNOSIS — N952 Postmenopausal atrophic vaginitis: Secondary | ICD-10-CM | POA: Diagnosis not present

## 2018-09-24 DIAGNOSIS — N760 Acute vaginitis: Secondary | ICD-10-CM | POA: Diagnosis not present

## 2018-09-24 DIAGNOSIS — N898 Other specified noninflammatory disorders of vagina: Secondary | ICD-10-CM | POA: Diagnosis not present

## 2018-09-24 DIAGNOSIS — N816 Rectocele: Secondary | ICD-10-CM | POA: Diagnosis not present

## 2018-11-03 DIAGNOSIS — M150 Primary generalized (osteo)arthritis: Secondary | ICD-10-CM | POA: Diagnosis not present

## 2018-11-03 DIAGNOSIS — L4050 Arthropathic psoriasis, unspecified: Secondary | ICD-10-CM | POA: Diagnosis not present

## 2018-11-03 DIAGNOSIS — M7989 Other specified soft tissue disorders: Secondary | ICD-10-CM | POA: Diagnosis not present

## 2018-11-03 DIAGNOSIS — M545 Low back pain: Secondary | ICD-10-CM | POA: Diagnosis not present

## 2018-11-03 DIAGNOSIS — M8589 Other specified disorders of bone density and structure, multiple sites: Secondary | ICD-10-CM | POA: Diagnosis not present

## 2018-11-03 DIAGNOSIS — L409 Psoriasis, unspecified: Secondary | ICD-10-CM | POA: Diagnosis not present

## 2018-11-03 DIAGNOSIS — E669 Obesity, unspecified: Secondary | ICD-10-CM | POA: Diagnosis not present

## 2018-11-03 DIAGNOSIS — Z79899 Other long term (current) drug therapy: Secondary | ICD-10-CM | POA: Diagnosis not present

## 2018-11-03 DIAGNOSIS — Z6830 Body mass index (BMI) 30.0-30.9, adult: Secondary | ICD-10-CM | POA: Diagnosis not present

## 2019-01-08 DIAGNOSIS — Z13220 Encounter for screening for lipoid disorders: Secondary | ICD-10-CM | POA: Diagnosis not present

## 2019-01-08 DIAGNOSIS — R7303 Prediabetes: Secondary | ICD-10-CM | POA: Diagnosis not present

## 2019-01-08 DIAGNOSIS — F419 Anxiety disorder, unspecified: Secondary | ICD-10-CM | POA: Diagnosis not present

## 2019-01-08 DIAGNOSIS — L4050 Arthropathic psoriasis, unspecified: Secondary | ICD-10-CM | POA: Diagnosis not present

## 2019-01-08 DIAGNOSIS — E559 Vitamin D deficiency, unspecified: Secondary | ICD-10-CM | POA: Diagnosis not present

## 2019-01-08 DIAGNOSIS — R030 Elevated blood-pressure reading, without diagnosis of hypertension: Secondary | ICD-10-CM | POA: Diagnosis not present

## 2019-01-08 DIAGNOSIS — F5101 Primary insomnia: Secondary | ICD-10-CM | POA: Diagnosis not present

## 2019-01-08 DIAGNOSIS — K219 Gastro-esophageal reflux disease without esophagitis: Secondary | ICD-10-CM | POA: Diagnosis not present

## 2019-02-03 DIAGNOSIS — L4050 Arthropathic psoriasis, unspecified: Secondary | ICD-10-CM | POA: Diagnosis not present

## 2019-04-20 DIAGNOSIS — R51 Headache: Secondary | ICD-10-CM | POA: Diagnosis not present

## 2019-04-20 DIAGNOSIS — R200 Anesthesia of skin: Secondary | ICD-10-CM | POA: Diagnosis not present

## 2019-04-21 DIAGNOSIS — F411 Generalized anxiety disorder: Secondary | ICD-10-CM | POA: Diagnosis not present

## 2019-04-21 DIAGNOSIS — G459 Transient cerebral ischemic attack, unspecified: Secondary | ICD-10-CM | POA: Diagnosis not present

## 2019-04-22 DIAGNOSIS — Z85828 Personal history of other malignant neoplasm of skin: Secondary | ICD-10-CM | POA: Diagnosis not present

## 2019-04-22 DIAGNOSIS — L821 Other seborrheic keratosis: Secondary | ICD-10-CM | POA: Diagnosis not present

## 2019-04-22 DIAGNOSIS — D225 Melanocytic nevi of trunk: Secondary | ICD-10-CM | POA: Diagnosis not present

## 2019-04-22 DIAGNOSIS — D1801 Hemangioma of skin and subcutaneous tissue: Secondary | ICD-10-CM | POA: Diagnosis not present

## 2019-04-28 ENCOUNTER — Other Ambulatory Visit: Payer: Self-pay | Admitting: Family Medicine

## 2019-04-28 DIAGNOSIS — G459 Transient cerebral ischemic attack, unspecified: Secondary | ICD-10-CM

## 2019-05-03 ENCOUNTER — Other Ambulatory Visit: Payer: Self-pay

## 2019-05-03 ENCOUNTER — Ambulatory Visit (INDEPENDENT_AMBULATORY_CARE_PROVIDER_SITE_OTHER): Payer: PPO

## 2019-05-03 DIAGNOSIS — G459 Transient cerebral ischemic attack, unspecified: Secondary | ICD-10-CM

## 2019-05-03 DIAGNOSIS — R200 Anesthesia of skin: Secondary | ICD-10-CM | POA: Diagnosis not present

## 2019-05-05 DIAGNOSIS — R0981 Nasal congestion: Secondary | ICD-10-CM | POA: Diagnosis not present

## 2019-05-05 DIAGNOSIS — R42 Dizziness and giddiness: Secondary | ICD-10-CM | POA: Diagnosis not present

## 2019-05-05 DIAGNOSIS — H6090 Unspecified otitis externa, unspecified ear: Secondary | ICD-10-CM | POA: Diagnosis not present

## 2019-05-06 DIAGNOSIS — Z6829 Body mass index (BMI) 29.0-29.9, adult: Secondary | ICD-10-CM | POA: Diagnosis not present

## 2019-05-06 DIAGNOSIS — L4050 Arthropathic psoriasis, unspecified: Secondary | ICD-10-CM | POA: Diagnosis not present

## 2019-05-06 DIAGNOSIS — M8589 Other specified disorders of bone density and structure, multiple sites: Secondary | ICD-10-CM | POA: Diagnosis not present

## 2019-05-06 DIAGNOSIS — E663 Overweight: Secondary | ICD-10-CM | POA: Diagnosis not present

## 2019-05-06 DIAGNOSIS — M150 Primary generalized (osteo)arthritis: Secondary | ICD-10-CM | POA: Diagnosis not present

## 2019-05-06 DIAGNOSIS — Z79899 Other long term (current) drug therapy: Secondary | ICD-10-CM | POA: Diagnosis not present

## 2019-05-06 DIAGNOSIS — L409 Psoriasis, unspecified: Secondary | ICD-10-CM | POA: Diagnosis not present

## 2019-05-06 DIAGNOSIS — M545 Low back pain: Secondary | ICD-10-CM | POA: Diagnosis not present

## 2019-05-06 DIAGNOSIS — M7989 Other specified soft tissue disorders: Secondary | ICD-10-CM | POA: Diagnosis not present

## 2019-05-25 ENCOUNTER — Other Ambulatory Visit: Payer: Self-pay | Admitting: Family Medicine

## 2019-05-25 DIAGNOSIS — Z1231 Encounter for screening mammogram for malignant neoplasm of breast: Secondary | ICD-10-CM

## 2019-05-26 ENCOUNTER — Ambulatory Visit
Admission: RE | Admit: 2019-05-26 | Discharge: 2019-05-26 | Disposition: A | Discharge status: Home / Self Care | Payer: PPO | Source: Ambulatory Visit | Attending: Family Medicine | Admitting: Family Medicine

## 2019-05-26 ENCOUNTER — Other Ambulatory Visit: Payer: Self-pay

## 2019-05-26 DIAGNOSIS — Z1231 Encounter for screening mammogram for malignant neoplasm of breast: Secondary | ICD-10-CM | POA: Diagnosis not present

## 2019-07-13 DIAGNOSIS — F411 Generalized anxiety disorder: Secondary | ICD-10-CM | POA: Diagnosis not present

## 2019-07-13 DIAGNOSIS — L237 Allergic contact dermatitis due to plants, except food: Secondary | ICD-10-CM | POA: Diagnosis not present

## 2019-07-13 DIAGNOSIS — Z79899 Other long term (current) drug therapy: Secondary | ICD-10-CM | POA: Diagnosis not present

## 2019-07-27 DIAGNOSIS — F419 Anxiety disorder, unspecified: Secondary | ICD-10-CM | POA: Diagnosis not present

## 2019-07-27 DIAGNOSIS — Z09 Encounter for follow-up examination after completed treatment for conditions other than malignant neoplasm: Secondary | ICD-10-CM | POA: Diagnosis not present

## 2019-08-07 DIAGNOSIS — M8589 Other specified disorders of bone density and structure, multiple sites: Secondary | ICD-10-CM | POA: Diagnosis not present

## 2019-08-07 DIAGNOSIS — Z6829 Body mass index (BMI) 29.0-29.9, adult: Secondary | ICD-10-CM | POA: Diagnosis not present

## 2019-08-07 DIAGNOSIS — L409 Psoriasis, unspecified: Secondary | ICD-10-CM | POA: Diagnosis not present

## 2019-08-07 DIAGNOSIS — M150 Primary generalized (osteo)arthritis: Secondary | ICD-10-CM | POA: Diagnosis not present

## 2019-08-07 DIAGNOSIS — M545 Low back pain: Secondary | ICD-10-CM | POA: Diagnosis not present

## 2019-08-07 DIAGNOSIS — L4050 Arthropathic psoriasis, unspecified: Secondary | ICD-10-CM | POA: Diagnosis not present

## 2019-08-07 DIAGNOSIS — M7989 Other specified soft tissue disorders: Secondary | ICD-10-CM | POA: Diagnosis not present

## 2019-08-07 DIAGNOSIS — M25512 Pain in left shoulder: Secondary | ICD-10-CM | POA: Diagnosis not present

## 2019-08-07 DIAGNOSIS — E663 Overweight: Secondary | ICD-10-CM | POA: Diagnosis not present

## 2019-08-07 DIAGNOSIS — Z79899 Other long term (current) drug therapy: Secondary | ICD-10-CM | POA: Diagnosis not present

## 2019-08-09 DIAGNOSIS — Z20828 Contact with and (suspected) exposure to other viral communicable diseases: Secondary | ICD-10-CM | POA: Diagnosis not present

## 2019-10-25 ENCOUNTER — Ambulatory Visit: Payer: PPO | Attending: Internal Medicine

## 2019-10-25 DIAGNOSIS — Z23 Encounter for immunization: Secondary | ICD-10-CM | POA: Insufficient documentation

## 2019-10-25 NOTE — Progress Notes (Signed)
   Covid-19 Vaccination Clinic  Name:  Catherine Rojas    MRN: [id number] DOB: 08/19/1951  10/25/2019  Catherine Rojas was observed post Covid-19 immunization for 15 minutes without incidence. She was provided with Vaccine Information Sheet and instruction to access the V-Safe system.   Catherine Rojas was instructed to call 911 with any severe reactions post vaccine: Marland Kitchen Difficulty breathing  . Swelling of your face and throat  . A fast heartbeat  . A bad rash all over your body  . Dizziness and weakness    Immunizations Administered    Name Date Dose VIS Date Route   Pfizer COVID-19 Vaccine 10/25/2019  8:38 AM 0.3 mL 08/14/2019 Intramuscular   Manufacturer: [place]   Lot: [id number]   [place]: [id number]

## 2019-11-05 DIAGNOSIS — L409 Psoriasis, unspecified: Secondary | ICD-10-CM | POA: Diagnosis not present

## 2019-11-05 DIAGNOSIS — Z6829 Body mass index (BMI) 29.0-29.9, adult: Secondary | ICD-10-CM | POA: Diagnosis not present

## 2019-11-05 DIAGNOSIS — L4050 Arthropathic psoriasis, unspecified: Secondary | ICD-10-CM | POA: Diagnosis not present

## 2019-11-05 DIAGNOSIS — M8589 Other specified disorders of bone density and structure, multiple sites: Secondary | ICD-10-CM | POA: Diagnosis not present

## 2019-11-05 DIAGNOSIS — M150 Primary generalized (osteo)arthritis: Secondary | ICD-10-CM | POA: Diagnosis not present

## 2019-11-05 DIAGNOSIS — M7989 Other specified soft tissue disorders: Secondary | ICD-10-CM | POA: Diagnosis not present

## 2019-11-05 DIAGNOSIS — Z79899 Other long term (current) drug therapy: Secondary | ICD-10-CM | POA: Diagnosis not present

## 2019-11-05 DIAGNOSIS — M25512 Pain in left shoulder: Secondary | ICD-10-CM | POA: Diagnosis not present

## 2019-11-05 DIAGNOSIS — M545 Low back pain: Secondary | ICD-10-CM | POA: Diagnosis not present

## 2019-11-18 ENCOUNTER — Ambulatory Visit: Payer: PPO | Attending: Internal Medicine

## 2019-11-18 DIAGNOSIS — Z23 Encounter for immunization: Secondary | ICD-10-CM

## 2019-11-18 NOTE — Progress Notes (Signed)
   Covid-19 Vaccination Clinic  Name:  Catherine Rojas    MRN: [id number] DOB: May 15, 1951  11/18/2019  [name] was observed post Covid-19 immunization for 15 minutes without incident. She was provided with Vaccine Information Sheet and instruction to access the V-Safe system.   [name] was instructed to call 911 with any severe reactions post vaccine: Marland Kitchen Difficulty breathing  . Swelling of face and throat  . A fast heartbeat  . A bad rash all over body  . Dizziness and weakness   Immunizations Administered    Name Date Dose VIS Date Route   Pfizer COVID-19 Vaccine 11/18/2019  1:03 PM 0.3 mL 08/14/2019 Intramuscular   Manufacturer: [place]   Lot: [id number]   [place]: [id number]

## 2020-02-04 DIAGNOSIS — M545 Low back pain: Secondary | ICD-10-CM | POA: Diagnosis not present

## 2020-02-04 DIAGNOSIS — Z79899 Other long term (current) drug therapy: Secondary | ICD-10-CM | POA: Diagnosis not present

## 2020-02-04 DIAGNOSIS — L409 Psoriasis, unspecified: Secondary | ICD-10-CM | POA: Diagnosis not present

## 2020-02-04 DIAGNOSIS — M150 Primary generalized (osteo)arthritis: Secondary | ICD-10-CM | POA: Diagnosis not present

## 2020-02-04 DIAGNOSIS — M8589 Other specified disorders of bone density and structure, multiple sites: Secondary | ICD-10-CM | POA: Diagnosis not present

## 2020-02-04 DIAGNOSIS — Z6829 Body mass index (BMI) 29.0-29.9, adult: Secondary | ICD-10-CM | POA: Diagnosis not present

## 2020-02-04 DIAGNOSIS — L4050 Arthropathic psoriasis, unspecified: Secondary | ICD-10-CM | POA: Diagnosis not present

## 2020-02-04 DIAGNOSIS — M7989 Other specified soft tissue disorders: Secondary | ICD-10-CM | POA: Diagnosis not present

## 2020-02-04 DIAGNOSIS — E663 Overweight: Secondary | ICD-10-CM | POA: Diagnosis not present

## 2020-03-31 DIAGNOSIS — E559 Vitamin D deficiency, unspecified: Secondary | ICD-10-CM | POA: Diagnosis not present

## 2020-03-31 DIAGNOSIS — I10 Essential (primary) hypertension: Secondary | ICD-10-CM | POA: Diagnosis not present

## 2020-03-31 DIAGNOSIS — H6121 Impacted cerumen, right ear: Secondary | ICD-10-CM | POA: Diagnosis not present

## 2020-03-31 DIAGNOSIS — R7303 Prediabetes: Secondary | ICD-10-CM | POA: Diagnosis not present

## 2020-03-31 DIAGNOSIS — E785 Hyperlipidemia, unspecified: Secondary | ICD-10-CM | POA: Diagnosis not present

## 2020-03-31 DIAGNOSIS — Z0000 Encounter for general adult medical examination without abnormal findings: Secondary | ICD-10-CM | POA: Diagnosis not present

## 2020-04-15 DIAGNOSIS — R7303 Prediabetes: Secondary | ICD-10-CM | POA: Diagnosis not present

## 2020-04-15 DIAGNOSIS — I10 Essential (primary) hypertension: Secondary | ICD-10-CM | POA: Diagnosis not present

## 2020-04-15 DIAGNOSIS — E785 Hyperlipidemia, unspecified: Secondary | ICD-10-CM | POA: Diagnosis not present

## 2020-04-25 DIAGNOSIS — E785 Hyperlipidemia, unspecified: Secondary | ICD-10-CM | POA: Diagnosis not present

## 2020-04-25 DIAGNOSIS — G459 Transient cerebral ischemic attack, unspecified: Secondary | ICD-10-CM | POA: Diagnosis not present

## 2020-04-25 DIAGNOSIS — I10 Essential (primary) hypertension: Secondary | ICD-10-CM | POA: Diagnosis not present

## 2020-05-03 DIAGNOSIS — L400 Psoriasis vulgaris: Secondary | ICD-10-CM | POA: Diagnosis not present

## 2020-05-03 DIAGNOSIS — D1809 Hemangioma of other sites: Secondary | ICD-10-CM | POA: Diagnosis not present

## 2020-05-03 DIAGNOSIS — D485 Neoplasm of uncertain behavior of skin: Secondary | ICD-10-CM | POA: Diagnosis not present

## 2020-05-03 DIAGNOSIS — L814 Other melanin hyperpigmentation: Secondary | ICD-10-CM | POA: Diagnosis not present

## 2020-05-03 DIAGNOSIS — Z85828 Personal history of other malignant neoplasm of skin: Secondary | ICD-10-CM | POA: Diagnosis not present

## 2020-05-03 DIAGNOSIS — L821 Other seborrheic keratosis: Secondary | ICD-10-CM | POA: Diagnosis not present

## 2020-05-03 DIAGNOSIS — D1801 Hemangioma of skin and subcutaneous tissue: Secondary | ICD-10-CM | POA: Diagnosis not present

## 2020-05-03 DIAGNOSIS — L905 Scar conditions and fibrosis of skin: Secondary | ICD-10-CM | POA: Diagnosis not present

## 2020-05-06 DIAGNOSIS — I10 Essential (primary) hypertension: Secondary | ICD-10-CM | POA: Diagnosis not present

## 2020-05-06 DIAGNOSIS — F411 Generalized anxiety disorder: Secondary | ICD-10-CM | POA: Diagnosis not present

## 2020-05-12 DIAGNOSIS — M8589 Other specified disorders of bone density and structure, multiple sites: Secondary | ICD-10-CM | POA: Diagnosis not present

## 2020-05-12 DIAGNOSIS — Z79899 Other long term (current) drug therapy: Secondary | ICD-10-CM | POA: Diagnosis not present

## 2020-05-12 DIAGNOSIS — M7989 Other specified soft tissue disorders: Secondary | ICD-10-CM | POA: Diagnosis not present

## 2020-05-12 DIAGNOSIS — L4050 Arthropathic psoriasis, unspecified: Secondary | ICD-10-CM | POA: Diagnosis not present

## 2020-05-12 DIAGNOSIS — L409 Psoriasis, unspecified: Secondary | ICD-10-CM | POA: Diagnosis not present

## 2020-05-12 DIAGNOSIS — E669 Obesity, unspecified: Secondary | ICD-10-CM | POA: Diagnosis not present

## 2020-05-12 DIAGNOSIS — M150 Primary generalized (osteo)arthritis: Secondary | ICD-10-CM | POA: Diagnosis not present

## 2020-05-12 DIAGNOSIS — Z6830 Body mass index (BMI) 30.0-30.9, adult: Secondary | ICD-10-CM | POA: Diagnosis not present

## 2020-05-12 DIAGNOSIS — M545 Low back pain: Secondary | ICD-10-CM | POA: Diagnosis not present

## 2020-05-13 ENCOUNTER — Other Ambulatory Visit: Payer: Self-pay | Admitting: Physician Assistant

## 2020-05-13 ENCOUNTER — Other Ambulatory Visit: Payer: Self-pay | Admitting: Family Medicine

## 2020-05-13 DIAGNOSIS — Z1231 Encounter for screening mammogram for malignant neoplasm of breast: Secondary | ICD-10-CM

## 2020-05-13 DIAGNOSIS — M8589 Other specified disorders of bone density and structure, multiple sites: Secondary | ICD-10-CM

## 2020-05-19 DIAGNOSIS — Z961 Presence of intraocular lens: Secondary | ICD-10-CM | POA: Diagnosis not present

## 2020-05-19 DIAGNOSIS — E119 Type 2 diabetes mellitus without complications: Secondary | ICD-10-CM | POA: Diagnosis not present

## 2020-05-19 DIAGNOSIS — H18513 Endothelial corneal dystrophy, bilateral: Secondary | ICD-10-CM | POA: Diagnosis not present

## 2020-05-19 DIAGNOSIS — H5202 Hypermetropia, left eye: Secondary | ICD-10-CM | POA: Diagnosis not present

## 2020-05-22 DIAGNOSIS — Z20822 Contact with and (suspected) exposure to covid-19: Secondary | ICD-10-CM | POA: Diagnosis not present

## 2020-06-01 ENCOUNTER — Ambulatory Visit: Payer: PPO

## 2020-06-13 DIAGNOSIS — E785 Hyperlipidemia, unspecified: Secondary | ICD-10-CM | POA: Diagnosis not present

## 2020-06-13 DIAGNOSIS — E7800 Pure hypercholesterolemia, unspecified: Secondary | ICD-10-CM | POA: Diagnosis not present

## 2020-06-13 DIAGNOSIS — I10 Essential (primary) hypertension: Secondary | ICD-10-CM | POA: Diagnosis not present

## 2020-06-13 DIAGNOSIS — G459 Transient cerebral ischemic attack, unspecified: Secondary | ICD-10-CM | POA: Diagnosis not present

## 2020-06-21 ENCOUNTER — Other Ambulatory Visit: Payer: Self-pay | Admitting: Family Medicine

## 2020-06-21 DIAGNOSIS — Z1231 Encounter for screening mammogram for malignant neoplasm of breast: Secondary | ICD-10-CM

## 2020-06-22 ENCOUNTER — Other Ambulatory Visit: Payer: Self-pay

## 2020-06-22 ENCOUNTER — Ambulatory Visit
Admission: RE | Admit: 2020-06-22 | Discharge: 2020-06-22 | Disposition: A | Discharge status: Home / Self Care | Payer: PPO | Source: Ambulatory Visit

## 2020-06-22 DIAGNOSIS — Z1231 Encounter for screening mammogram for malignant neoplasm of breast: Secondary | ICD-10-CM

## 2020-07-06 DIAGNOSIS — I10 Essential (primary) hypertension: Secondary | ICD-10-CM | POA: Diagnosis not present

## 2020-07-06 DIAGNOSIS — E785 Hyperlipidemia, unspecified: Secondary | ICD-10-CM | POA: Diagnosis not present

## 2020-07-06 DIAGNOSIS — R7303 Prediabetes: Secondary | ICD-10-CM | POA: Diagnosis not present

## 2020-07-12 DIAGNOSIS — I10 Essential (primary) hypertension: Secondary | ICD-10-CM | POA: Diagnosis not present

## 2020-07-12 DIAGNOSIS — Z6830 Body mass index (BMI) 30.0-30.9, adult: Secondary | ICD-10-CM | POA: Diagnosis not present

## 2020-07-12 DIAGNOSIS — Z79899 Other long term (current) drug therapy: Secondary | ICD-10-CM | POA: Diagnosis not present

## 2020-07-12 DIAGNOSIS — E669 Obesity, unspecified: Secondary | ICD-10-CM | POA: Diagnosis not present

## 2020-07-12 DIAGNOSIS — E7800 Pure hypercholesterolemia, unspecified: Secondary | ICD-10-CM | POA: Diagnosis not present

## 2020-07-12 DIAGNOSIS — R7303 Prediabetes: Secondary | ICD-10-CM | POA: Diagnosis not present

## 2020-07-14 DIAGNOSIS — E7800 Pure hypercholesterolemia, unspecified: Secondary | ICD-10-CM | POA: Diagnosis not present

## 2020-07-14 DIAGNOSIS — K219 Gastro-esophageal reflux disease without esophagitis: Secondary | ICD-10-CM | POA: Diagnosis not present

## 2020-07-14 DIAGNOSIS — E785 Hyperlipidemia, unspecified: Secondary | ICD-10-CM | POA: Diagnosis not present

## 2020-07-14 DIAGNOSIS — I10 Essential (primary) hypertension: Secondary | ICD-10-CM | POA: Diagnosis not present

## 2020-07-14 DIAGNOSIS — G459 Transient cerebral ischemic attack, unspecified: Secondary | ICD-10-CM | POA: Diagnosis not present

## 2020-08-08 DIAGNOSIS — E663 Overweight: Secondary | ICD-10-CM | POA: Diagnosis not present

## 2020-08-08 DIAGNOSIS — M150 Primary generalized (osteo)arthritis: Secondary | ICD-10-CM | POA: Diagnosis not present

## 2020-08-08 DIAGNOSIS — L409 Psoriasis, unspecified: Secondary | ICD-10-CM | POA: Diagnosis not present

## 2020-08-08 DIAGNOSIS — M7989 Other specified soft tissue disorders: Secondary | ICD-10-CM | POA: Diagnosis not present

## 2020-08-08 DIAGNOSIS — M8589 Other specified disorders of bone density and structure, multiple sites: Secondary | ICD-10-CM | POA: Diagnosis not present

## 2020-08-08 DIAGNOSIS — M545 Low back pain, unspecified: Secondary | ICD-10-CM | POA: Diagnosis not present

## 2020-08-08 DIAGNOSIS — Z6829 Body mass index (BMI) 29.0-29.9, adult: Secondary | ICD-10-CM | POA: Diagnosis not present

## 2020-08-08 DIAGNOSIS — Z79899 Other long term (current) drug therapy: Secondary | ICD-10-CM | POA: Diagnosis not present

## 2020-08-08 DIAGNOSIS — L4050 Arthropathic psoriasis, unspecified: Secondary | ICD-10-CM | POA: Diagnosis not present

## 2020-08-15 DIAGNOSIS — E785 Hyperlipidemia, unspecified: Secondary | ICD-10-CM | POA: Diagnosis not present

## 2020-08-15 DIAGNOSIS — G459 Transient cerebral ischemic attack, unspecified: Secondary | ICD-10-CM | POA: Diagnosis not present

## 2020-08-15 DIAGNOSIS — I10 Essential (primary) hypertension: Secondary | ICD-10-CM | POA: Diagnosis not present

## 2020-08-15 DIAGNOSIS — E7800 Pure hypercholesterolemia, unspecified: Secondary | ICD-10-CM | POA: Diagnosis not present

## 2020-08-15 DIAGNOSIS — K219 Gastro-esophageal reflux disease without esophagitis: Secondary | ICD-10-CM | POA: Diagnosis not present

## 2020-08-16 DIAGNOSIS — R309 Painful micturition, unspecified: Secondary | ICD-10-CM | POA: Diagnosis not present

## 2020-08-16 DIAGNOSIS — N3000 Acute cystitis without hematuria: Secondary | ICD-10-CM | POA: Diagnosis not present

## 2020-08-16 DIAGNOSIS — R350 Frequency of micturition: Secondary | ICD-10-CM | POA: Diagnosis not present

## 2020-08-29 ENCOUNTER — Other Ambulatory Visit: Payer: Self-pay

## 2020-08-29 ENCOUNTER — Ambulatory Visit
Admission: RE | Admit: 2020-08-29 | Discharge: 2020-08-29 | Disposition: A | Discharge status: Home / Self Care | Payer: PPO | Source: Ambulatory Visit | Attending: Physician Assistant | Admitting: Physician Assistant

## 2020-08-29 DIAGNOSIS — Z780 Asymptomatic menopausal state: Secondary | ICD-10-CM | POA: Diagnosis not present

## 2020-08-29 DIAGNOSIS — M8589 Other specified disorders of bone density and structure, multiple sites: Secondary | ICD-10-CM

## 2020-09-14 DIAGNOSIS — E785 Hyperlipidemia, unspecified: Secondary | ICD-10-CM | POA: Diagnosis not present

## 2020-09-14 DIAGNOSIS — K219 Gastro-esophageal reflux disease without esophagitis: Secondary | ICD-10-CM | POA: Diagnosis not present

## 2020-09-14 DIAGNOSIS — G459 Transient cerebral ischemic attack, unspecified: Secondary | ICD-10-CM | POA: Diagnosis not present

## 2020-09-14 DIAGNOSIS — I10 Essential (primary) hypertension: Secondary | ICD-10-CM | POA: Diagnosis not present

## 2020-09-14 DIAGNOSIS — E7800 Pure hypercholesterolemia, unspecified: Secondary | ICD-10-CM | POA: Diagnosis not present

## 2020-10-11 DIAGNOSIS — I10 Essential (primary) hypertension: Secondary | ICD-10-CM | POA: Diagnosis not present

## 2020-10-11 DIAGNOSIS — R7303 Prediabetes: Secondary | ICD-10-CM | POA: Diagnosis not present

## 2020-10-11 DIAGNOSIS — E7800 Pure hypercholesterolemia, unspecified: Secondary | ICD-10-CM | POA: Diagnosis not present

## 2020-10-11 DIAGNOSIS — N3000 Acute cystitis without hematuria: Secondary | ICD-10-CM | POA: Diagnosis not present

## 2020-10-11 DIAGNOSIS — Z6830 Body mass index (BMI) 30.0-30.9, adult: Secondary | ICD-10-CM | POA: Diagnosis not present

## 2020-10-11 DIAGNOSIS — R300 Dysuria: Secondary | ICD-10-CM | POA: Diagnosis not present

## 2020-10-11 DIAGNOSIS — Z79899 Other long term (current) drug therapy: Secondary | ICD-10-CM | POA: Diagnosis not present

## 2020-10-11 DIAGNOSIS — E669 Obesity, unspecified: Secondary | ICD-10-CM | POA: Diagnosis not present

## 2020-11-07 DIAGNOSIS — L409 Psoriasis, unspecified: Secondary | ICD-10-CM | POA: Diagnosis not present

## 2020-11-07 DIAGNOSIS — Z79899 Other long term (current) drug therapy: Secondary | ICD-10-CM | POA: Diagnosis not present

## 2020-11-07 DIAGNOSIS — E669 Obesity, unspecified: Secondary | ICD-10-CM | POA: Diagnosis not present

## 2020-11-07 DIAGNOSIS — M7989 Other specified soft tissue disorders: Secondary | ICD-10-CM | POA: Diagnosis not present

## 2020-11-07 DIAGNOSIS — M8589 Other specified disorders of bone density and structure, multiple sites: Secondary | ICD-10-CM | POA: Diagnosis not present

## 2020-11-07 DIAGNOSIS — M150 Primary generalized (osteo)arthritis: Secondary | ICD-10-CM | POA: Diagnosis not present

## 2020-11-07 DIAGNOSIS — M545 Low back pain, unspecified: Secondary | ICD-10-CM | POA: Diagnosis not present

## 2020-11-07 DIAGNOSIS — Z6830 Body mass index (BMI) 30.0-30.9, adult: Secondary | ICD-10-CM | POA: Diagnosis not present

## 2020-11-07 DIAGNOSIS — L4050 Arthropathic psoriasis, unspecified: Secondary | ICD-10-CM | POA: Diagnosis not present

## 2020-12-12 DIAGNOSIS — I10 Essential (primary) hypertension: Secondary | ICD-10-CM | POA: Diagnosis not present

## 2020-12-12 DIAGNOSIS — E785 Hyperlipidemia, unspecified: Secondary | ICD-10-CM | POA: Diagnosis not present

## 2020-12-12 DIAGNOSIS — G459 Transient cerebral ischemic attack, unspecified: Secondary | ICD-10-CM | POA: Diagnosis not present

## 2020-12-12 DIAGNOSIS — L4050 Arthropathic psoriasis, unspecified: Secondary | ICD-10-CM | POA: Diagnosis not present

## 2020-12-12 DIAGNOSIS — K219 Gastro-esophageal reflux disease without esophagitis: Secondary | ICD-10-CM | POA: Diagnosis not present

## 2020-12-12 DIAGNOSIS — E7800 Pure hypercholesterolemia, unspecified: Secondary | ICD-10-CM | POA: Diagnosis not present

## 2021-01-16 DIAGNOSIS — G459 Transient cerebral ischemic attack, unspecified: Secondary | ICD-10-CM | POA: Diagnosis not present

## 2021-01-16 DIAGNOSIS — E7800 Pure hypercholesterolemia, unspecified: Secondary | ICD-10-CM | POA: Diagnosis not present

## 2021-01-16 DIAGNOSIS — L4050 Arthropathic psoriasis, unspecified: Secondary | ICD-10-CM | POA: Diagnosis not present

## 2021-01-16 DIAGNOSIS — K219 Gastro-esophageal reflux disease without esophagitis: Secondary | ICD-10-CM | POA: Diagnosis not present

## 2021-01-16 DIAGNOSIS — I10 Essential (primary) hypertension: Secondary | ICD-10-CM | POA: Diagnosis not present

## 2021-01-17 DIAGNOSIS — K219 Gastro-esophageal reflux disease without esophagitis: Secondary | ICD-10-CM | POA: Diagnosis not present

## 2021-01-17 DIAGNOSIS — E7800 Pure hypercholesterolemia, unspecified: Secondary | ICD-10-CM | POA: Diagnosis not present

## 2021-01-17 DIAGNOSIS — Z6830 Body mass index (BMI) 30.0-30.9, adult: Secondary | ICD-10-CM | POA: Diagnosis not present

## 2021-01-17 DIAGNOSIS — L4050 Arthropathic psoriasis, unspecified: Secondary | ICD-10-CM | POA: Diagnosis not present

## 2021-01-17 DIAGNOSIS — F411 Generalized anxiety disorder: Secondary | ICD-10-CM | POA: Diagnosis not present

## 2021-01-17 DIAGNOSIS — E669 Obesity, unspecified: Secondary | ICD-10-CM | POA: Diagnosis not present

## 2021-01-17 DIAGNOSIS — I10 Essential (primary) hypertension: Secondary | ICD-10-CM | POA: Diagnosis not present

## 2021-02-06 DIAGNOSIS — L4050 Arthropathic psoriasis, unspecified: Secondary | ICD-10-CM | POA: Diagnosis not present

## 2021-02-06 DIAGNOSIS — I10 Essential (primary) hypertension: Secondary | ICD-10-CM | POA: Diagnosis not present

## 2021-02-06 DIAGNOSIS — E7800 Pure hypercholesterolemia, unspecified: Secondary | ICD-10-CM | POA: Diagnosis not present

## 2021-02-06 DIAGNOSIS — K219 Gastro-esophageal reflux disease without esophagitis: Secondary | ICD-10-CM | POA: Diagnosis not present

## 2021-02-06 DIAGNOSIS — G459 Transient cerebral ischemic attack, unspecified: Secondary | ICD-10-CM | POA: Diagnosis not present

## 2021-02-07 DIAGNOSIS — M150 Primary generalized (osteo)arthritis: Secondary | ICD-10-CM | POA: Diagnosis not present

## 2021-02-07 DIAGNOSIS — L4050 Arthropathic psoriasis, unspecified: Secondary | ICD-10-CM | POA: Diagnosis not present

## 2021-02-07 DIAGNOSIS — M545 Low back pain, unspecified: Secondary | ICD-10-CM | POA: Diagnosis not present

## 2021-02-07 DIAGNOSIS — L409 Psoriasis, unspecified: Secondary | ICD-10-CM | POA: Diagnosis not present

## 2021-02-07 DIAGNOSIS — Z6830 Body mass index (BMI) 30.0-30.9, adult: Secondary | ICD-10-CM | POA: Diagnosis not present

## 2021-02-07 DIAGNOSIS — M8589 Other specified disorders of bone density and structure, multiple sites: Secondary | ICD-10-CM | POA: Diagnosis not present

## 2021-02-07 DIAGNOSIS — Z79899 Other long term (current) drug therapy: Secondary | ICD-10-CM | POA: Diagnosis not present

## 2021-02-07 DIAGNOSIS — M7989 Other specified soft tissue disorders: Secondary | ICD-10-CM | POA: Diagnosis not present

## 2021-02-07 DIAGNOSIS — E669 Obesity, unspecified: Secondary | ICD-10-CM | POA: Diagnosis not present

## 2021-03-08 DIAGNOSIS — G459 Transient cerebral ischemic attack, unspecified: Secondary | ICD-10-CM | POA: Diagnosis not present

## 2021-03-08 DIAGNOSIS — L4050 Arthropathic psoriasis, unspecified: Secondary | ICD-10-CM | POA: Diagnosis not present

## 2021-03-08 DIAGNOSIS — K219 Gastro-esophageal reflux disease without esophagitis: Secondary | ICD-10-CM | POA: Diagnosis not present

## 2021-03-08 DIAGNOSIS — I10 Essential (primary) hypertension: Secondary | ICD-10-CM | POA: Diagnosis not present

## 2021-03-08 DIAGNOSIS — E7800 Pure hypercholesterolemia, unspecified: Secondary | ICD-10-CM | POA: Diagnosis not present

## 2021-04-06 DIAGNOSIS — Z20822 Contact with and (suspected) exposure to covid-19: Secondary | ICD-10-CM | POA: Diagnosis not present

## 2021-04-18 DIAGNOSIS — H1130 Conjunctival hemorrhage, unspecified eye: Secondary | ICD-10-CM | POA: Diagnosis not present

## 2021-05-10 DIAGNOSIS — Z6830 Body mass index (BMI) 30.0-30.9, adult: Secondary | ICD-10-CM | POA: Diagnosis not present

## 2021-05-10 DIAGNOSIS — M7989 Other specified soft tissue disorders: Secondary | ICD-10-CM | POA: Diagnosis not present

## 2021-05-10 DIAGNOSIS — M545 Low back pain, unspecified: Secondary | ICD-10-CM | POA: Diagnosis not present

## 2021-05-10 DIAGNOSIS — E669 Obesity, unspecified: Secondary | ICD-10-CM | POA: Diagnosis not present

## 2021-05-10 DIAGNOSIS — M150 Primary generalized (osteo)arthritis: Secondary | ICD-10-CM | POA: Diagnosis not present

## 2021-05-10 DIAGNOSIS — L4050 Arthropathic psoriasis, unspecified: Secondary | ICD-10-CM | POA: Diagnosis not present

## 2021-05-10 DIAGNOSIS — Z79899 Other long term (current) drug therapy: Secondary | ICD-10-CM | POA: Diagnosis not present

## 2021-05-10 DIAGNOSIS — L409 Psoriasis, unspecified: Secondary | ICD-10-CM | POA: Diagnosis not present

## 2021-05-10 DIAGNOSIS — M8589 Other specified disorders of bone density and structure, multiple sites: Secondary | ICD-10-CM | POA: Diagnosis not present

## 2021-06-05 DIAGNOSIS — Z85828 Personal history of other malignant neoplasm of skin: Secondary | ICD-10-CM | POA: Diagnosis not present

## 2021-06-05 DIAGNOSIS — L723 Sebaceous cyst: Secondary | ICD-10-CM | POA: Diagnosis not present

## 2021-06-05 DIAGNOSIS — D2272 Melanocytic nevi of left lower limb, including hip: Secondary | ICD-10-CM | POA: Diagnosis not present

## 2021-06-05 DIAGNOSIS — L853 Xerosis cutis: Secondary | ICD-10-CM | POA: Diagnosis not present

## 2021-06-05 DIAGNOSIS — L814 Other melanin hyperpigmentation: Secondary | ICD-10-CM | POA: Diagnosis not present

## 2021-06-05 DIAGNOSIS — D1801 Hemangioma of skin and subcutaneous tissue: Secondary | ICD-10-CM | POA: Diagnosis not present

## 2021-06-05 DIAGNOSIS — L738 Other specified follicular disorders: Secondary | ICD-10-CM | POA: Diagnosis not present

## 2021-06-05 DIAGNOSIS — L237 Allergic contact dermatitis due to plants, except food: Secondary | ICD-10-CM | POA: Diagnosis not present

## 2021-06-07 DIAGNOSIS — I10 Essential (primary) hypertension: Secondary | ICD-10-CM | POA: Diagnosis not present

## 2021-06-07 DIAGNOSIS — K219 Gastro-esophageal reflux disease without esophagitis: Secondary | ICD-10-CM | POA: Diagnosis not present

## 2021-06-07 DIAGNOSIS — E7800 Pure hypercholesterolemia, unspecified: Secondary | ICD-10-CM | POA: Diagnosis not present

## 2021-06-07 DIAGNOSIS — L4050 Arthropathic psoriasis, unspecified: Secondary | ICD-10-CM | POA: Diagnosis not present

## 2021-06-07 DIAGNOSIS — G459 Transient cerebral ischemic attack, unspecified: Secondary | ICD-10-CM | POA: Diagnosis not present

## 2021-06-26 ENCOUNTER — Other Ambulatory Visit: Payer: Self-pay | Admitting: Family Medicine

## 2021-06-26 DIAGNOSIS — Z1231 Encounter for screening mammogram for malignant neoplasm of breast: Secondary | ICD-10-CM

## 2021-07-04 ENCOUNTER — Ambulatory Visit
Admission: RE | Admit: 2021-07-04 | Discharge: 2021-07-04 | Disposition: A | Discharge status: Home / Self Care | Payer: PPO | Source: Ambulatory Visit | Attending: Family Medicine | Admitting: Family Medicine

## 2021-07-04 ENCOUNTER — Other Ambulatory Visit: Payer: Self-pay

## 2021-07-04 DIAGNOSIS — Z1389 Encounter for screening for other disorder: Secondary | ICD-10-CM | POA: Diagnosis not present

## 2021-07-04 DIAGNOSIS — Z1231 Encounter for screening mammogram for malignant neoplasm of breast: Secondary | ICD-10-CM

## 2021-07-04 DIAGNOSIS — Z0000 Encounter for general adult medical examination without abnormal findings: Secondary | ICD-10-CM | POA: Diagnosis not present

## 2021-07-11 DIAGNOSIS — Z20822 Contact with and (suspected) exposure to covid-19: Secondary | ICD-10-CM | POA: Diagnosis not present

## 2021-07-17 DIAGNOSIS — H5202 Hypermetropia, left eye: Secondary | ICD-10-CM | POA: Diagnosis not present

## 2021-07-17 DIAGNOSIS — Z961 Presence of intraocular lens: Secondary | ICD-10-CM | POA: Diagnosis not present

## 2021-07-17 DIAGNOSIS — H18513 Endothelial corneal dystrophy, bilateral: Secondary | ICD-10-CM | POA: Diagnosis not present

## 2021-07-17 DIAGNOSIS — E119 Type 2 diabetes mellitus without complications: Secondary | ICD-10-CM | POA: Diagnosis not present

## 2021-08-01 DIAGNOSIS — N1831 Chronic kidney disease, stage 3a: Secondary | ICD-10-CM | POA: Diagnosis not present

## 2021-08-01 DIAGNOSIS — L4050 Arthropathic psoriasis, unspecified: Secondary | ICD-10-CM | POA: Diagnosis not present

## 2021-08-01 DIAGNOSIS — M1991 Primary osteoarthritis, unspecified site: Secondary | ICD-10-CM | POA: Diagnosis not present

## 2021-08-01 DIAGNOSIS — E669 Obesity, unspecified: Secondary | ICD-10-CM | POA: Diagnosis not present

## 2021-08-01 DIAGNOSIS — R7303 Prediabetes: Secondary | ICD-10-CM | POA: Diagnosis not present

## 2021-08-01 DIAGNOSIS — E7800 Pure hypercholesterolemia, unspecified: Secondary | ICD-10-CM | POA: Diagnosis not present

## 2021-08-01 DIAGNOSIS — I129 Hypertensive chronic kidney disease with stage 1 through stage 4 chronic kidney disease, or unspecified chronic kidney disease: Secondary | ICD-10-CM | POA: Diagnosis not present

## 2021-08-01 DIAGNOSIS — F411 Generalized anxiety disorder: Secondary | ICD-10-CM | POA: Diagnosis not present

## 2021-08-01 DIAGNOSIS — K219 Gastro-esophageal reflux disease without esophagitis: Secondary | ICD-10-CM | POA: Diagnosis not present

## 2021-08-09 DIAGNOSIS — L4050 Arthropathic psoriasis, unspecified: Secondary | ICD-10-CM | POA: Diagnosis not present

## 2022-03-02 IMAGING — MG DIGITAL SCREENING BILAT W/ TOMO W/ CAD
8 series · 8 of 24 positions shown · non-contrast
Comparison: Previous exam(s).

CLINICAL DATA: Screening.

EXAM:
DIGITAL SCREENING BILATERAL MAMMOGRAM WITH TOMO AND CAD

[R CC synth-2D]
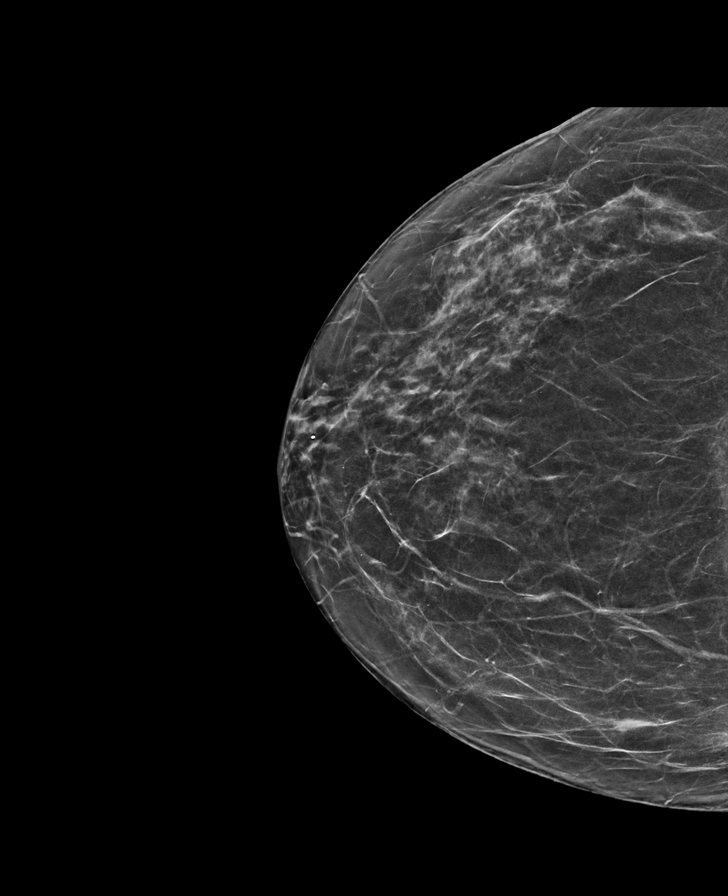

[L MLO synth-2D]
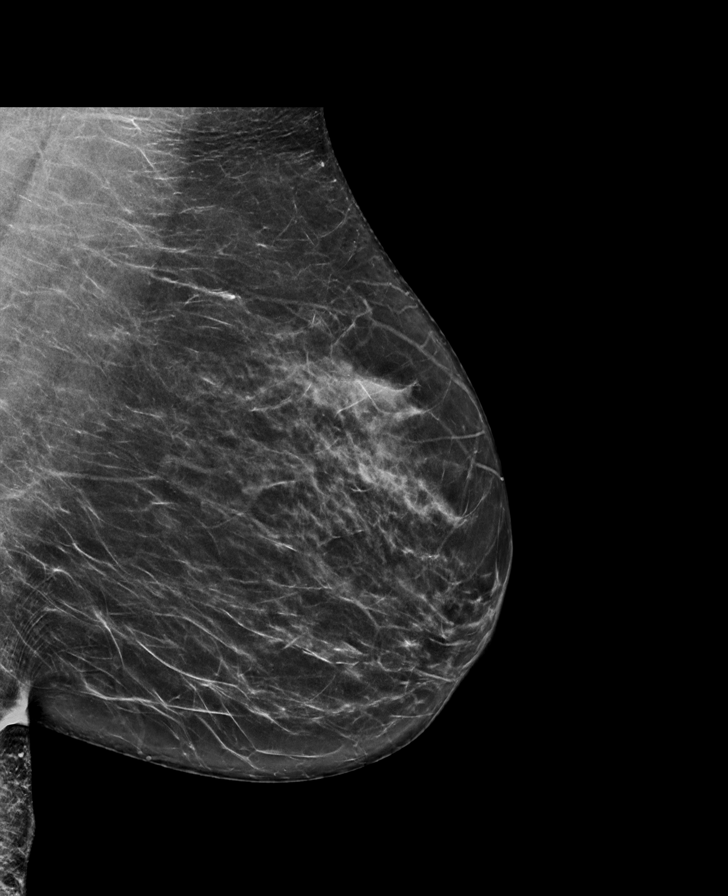

[R MLO synth-2D]
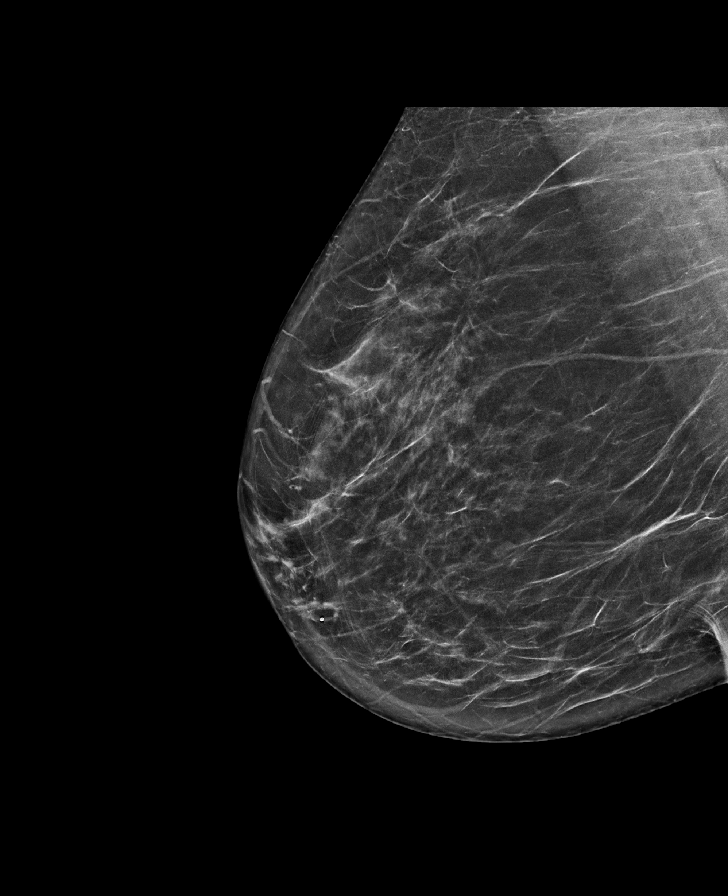

[L CC synth-2D]
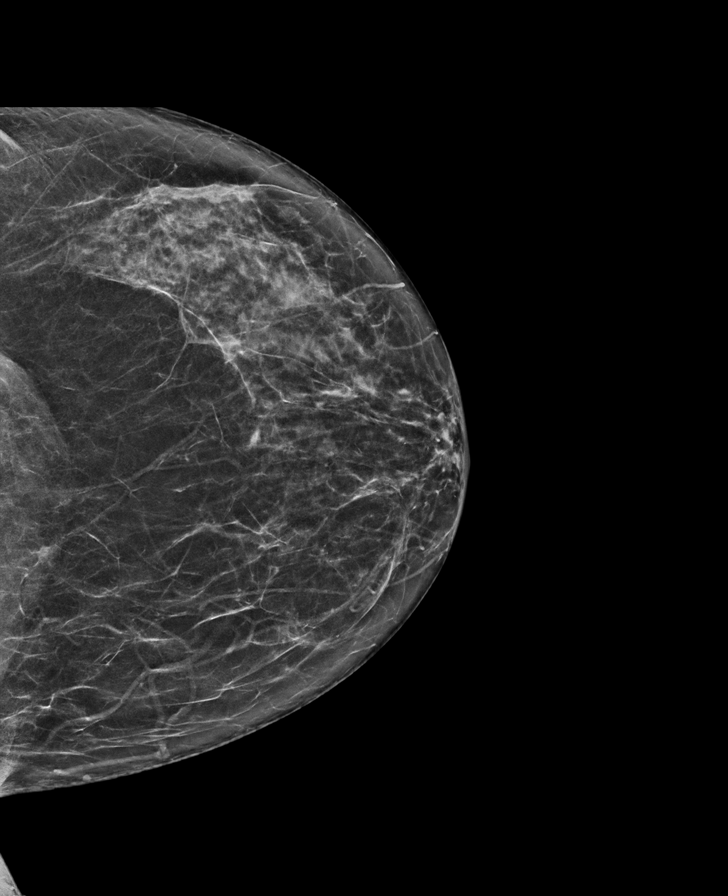

[R MLO tomo · tomo slice 36/71.0]
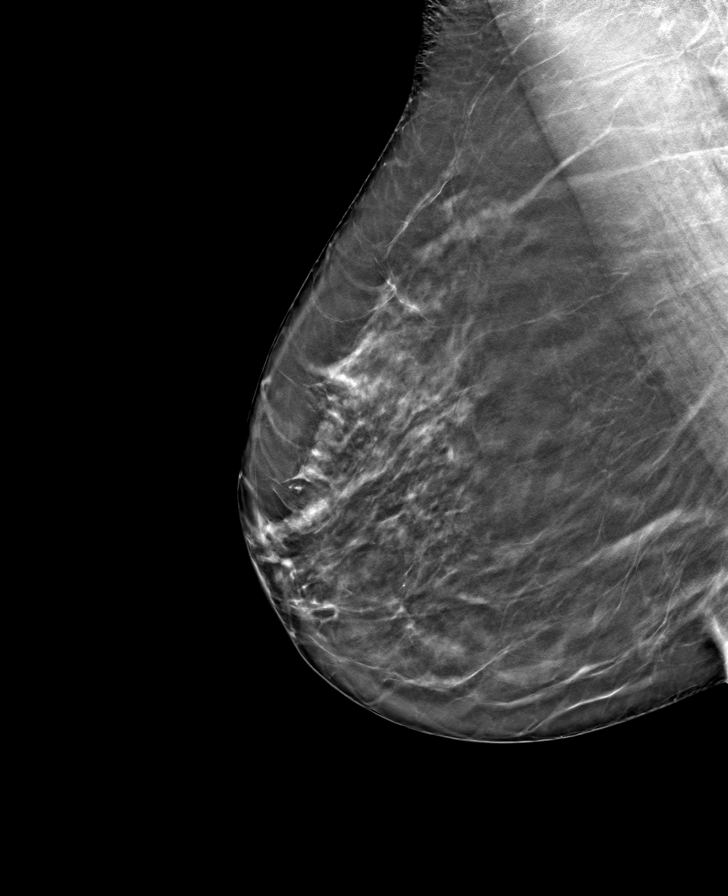

[L CC tomo · tomo slice 33/66.0]
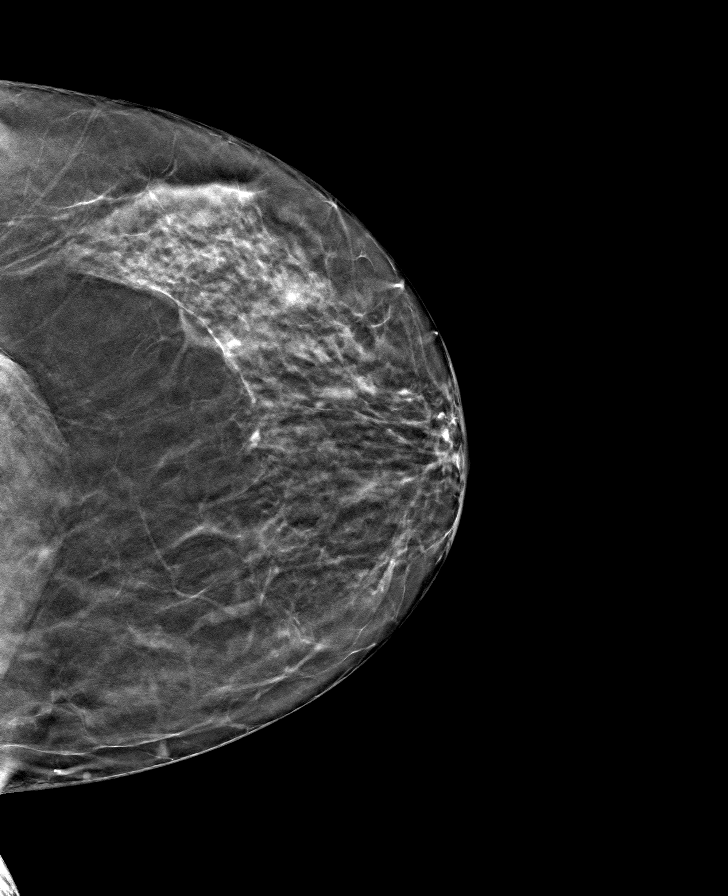

[L MLO tomo · tomo slice 36/71.0]
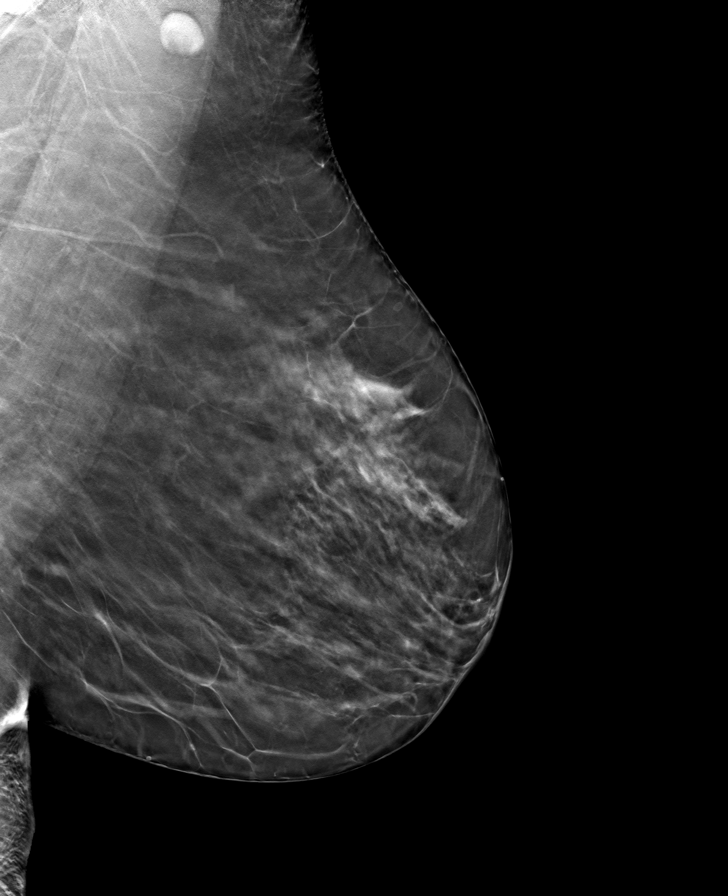

[R CC tomo · tomo slice 33/64.0]
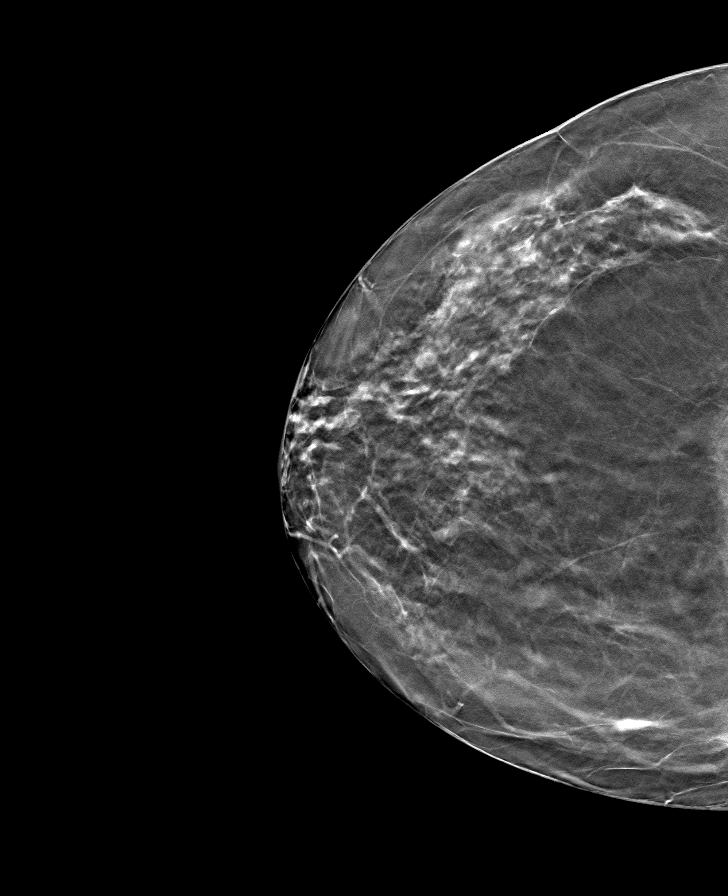

[8 of 24 positions shown; findings below may reference images not displayed]

ACR Breast Density Category b: There are scattered areas of
fibroglandular density.
FINDINGS: There are no findings suspicious for malignancy. Images were
processed with CAD.
IMPRESSION: No mammographic evidence of malignancy. A result letter of this
screening mammogram will be mailed directly to the patient.

RECOMMENDATION:
Screening mammogram in one year. (Code:[id number])

BI-RADS CATEGORY  1: Negative.

## 2022-03-30 ENCOUNTER — Other Ambulatory Visit: Payer: Self-pay | Admitting: Family Medicine

## 2022-03-30 DIAGNOSIS — N6313 Unspecified lump in the right breast, lower outer quadrant: Secondary | ICD-10-CM

## 2022-04-04 ENCOUNTER — Other Ambulatory Visit: Payer: Self-pay | Admitting: Family Medicine

## 2022-04-04 ENCOUNTER — Ambulatory Visit
Admission: RE | Admit: 2022-04-04 | Discharge: 2022-04-04 | Disposition: A | Discharge status: Home / Self Care | Payer: PPO | Source: Ambulatory Visit | Attending: Family Medicine | Admitting: Family Medicine

## 2022-04-04 DIAGNOSIS — N6313 Unspecified lump in the right breast, lower outer quadrant: Secondary | ICD-10-CM

## 2022-11-13 DIAGNOSIS — M8589 Other specified disorders of bone density and structure, multiple sites: Secondary | ICD-10-CM | POA: Diagnosis not present

## 2022-11-13 DIAGNOSIS — L409 Psoriasis, unspecified: Secondary | ICD-10-CM | POA: Diagnosis not present

## 2022-11-13 DIAGNOSIS — R5383 Other fatigue: Secondary | ICD-10-CM | POA: Diagnosis not present

## 2022-11-13 DIAGNOSIS — M7989 Other specified soft tissue disorders: Secondary | ICD-10-CM | POA: Diagnosis not present

## 2022-11-13 DIAGNOSIS — Z6830 Body mass index (BMI) 30.0-30.9, adult: Secondary | ICD-10-CM | POA: Diagnosis not present

## 2022-11-13 DIAGNOSIS — E669 Obesity, unspecified: Secondary | ICD-10-CM | POA: Diagnosis not present

## 2022-11-13 DIAGNOSIS — M1991 Primary osteoarthritis, unspecified site: Secondary | ICD-10-CM | POA: Diagnosis not present

## 2022-11-13 DIAGNOSIS — Z79899 Other long term (current) drug therapy: Secondary | ICD-10-CM | POA: Diagnosis not present

## 2022-11-13 DIAGNOSIS — L4050 Arthropathic psoriasis, unspecified: Secondary | ICD-10-CM | POA: Diagnosis not present

## 2022-11-13 DIAGNOSIS — M545 Low back pain, unspecified: Secondary | ICD-10-CM | POA: Diagnosis not present

## 2023-05-21 DIAGNOSIS — M8589 Other specified disorders of bone density and structure, multiple sites: Secondary | ICD-10-CM | POA: Diagnosis not present

## 2023-05-21 DIAGNOSIS — M545 Low back pain, unspecified: Secondary | ICD-10-CM | POA: Diagnosis not present

## 2023-05-21 DIAGNOSIS — Z6829 Body mass index (BMI) 29.0-29.9, adult: Secondary | ICD-10-CM | POA: Diagnosis not present

## 2023-05-21 DIAGNOSIS — Z79899 Other long term (current) drug therapy: Secondary | ICD-10-CM | POA: Diagnosis not present

## 2023-05-21 DIAGNOSIS — M7989 Other specified soft tissue disorders: Secondary | ICD-10-CM | POA: Diagnosis not present

## 2023-05-21 DIAGNOSIS — R5383 Other fatigue: Secondary | ICD-10-CM | POA: Diagnosis not present

## 2023-05-21 DIAGNOSIS — E663 Overweight: Secondary | ICD-10-CM | POA: Diagnosis not present

## 2023-05-21 DIAGNOSIS — M1991 Primary osteoarthritis, unspecified site: Secondary | ICD-10-CM | POA: Diagnosis not present

## 2023-05-21 DIAGNOSIS — L4050 Arthropathic psoriasis, unspecified: Secondary | ICD-10-CM | POA: Diagnosis not present

## 2023-05-21 DIAGNOSIS — L409 Psoriasis, unspecified: Secondary | ICD-10-CM | POA: Diagnosis not present

## 2023-07-11 DIAGNOSIS — Z6829 Body mass index (BMI) 29.0-29.9, adult: Secondary | ICD-10-CM | POA: Diagnosis not present

## 2023-07-11 DIAGNOSIS — Z1331 Encounter for screening for depression: Secondary | ICD-10-CM | POA: Diagnosis not present

## 2023-07-11 DIAGNOSIS — Z9181 History of falling: Secondary | ICD-10-CM | POA: Diagnosis not present

## 2023-07-11 DIAGNOSIS — Z1211 Encounter for screening for malignant neoplasm of colon: Secondary | ICD-10-CM | POA: Diagnosis not present

## 2023-07-11 DIAGNOSIS — Z0000 Encounter for general adult medical examination without abnormal findings: Secondary | ICD-10-CM | POA: Diagnosis not present

## 2023-07-11 DIAGNOSIS — Z23 Encounter for immunization: Secondary | ICD-10-CM | POA: Diagnosis not present

## 2023-08-04 DIAGNOSIS — Z1211 Encounter for screening for malignant neoplasm of colon: Secondary | ICD-10-CM | POA: Diagnosis not present

## 2023-08-04 DIAGNOSIS — Z1212 Encounter for screening for malignant neoplasm of rectum: Secondary | ICD-10-CM | POA: Diagnosis not present

## 2023-08-07 DIAGNOSIS — Z6829 Body mass index (BMI) 29.0-29.9, adult: Secondary | ICD-10-CM | POA: Diagnosis not present

## 2023-08-07 DIAGNOSIS — I129 Hypertensive chronic kidney disease with stage 1 through stage 4 chronic kidney disease, or unspecified chronic kidney disease: Secondary | ICD-10-CM | POA: Diagnosis not present

## 2023-08-07 DIAGNOSIS — L4050 Arthropathic psoriasis, unspecified: Secondary | ICD-10-CM | POA: Diagnosis not present

## 2023-08-07 DIAGNOSIS — N1831 Chronic kidney disease, stage 3a: Secondary | ICD-10-CM | POA: Diagnosis not present

## 2023-08-07 DIAGNOSIS — F411 Generalized anxiety disorder: Secondary | ICD-10-CM | POA: Diagnosis not present

## 2023-08-07 DIAGNOSIS — N952 Postmenopausal atrophic vaginitis: Secondary | ICD-10-CM | POA: Diagnosis not present

## 2023-08-07 DIAGNOSIS — R7303 Prediabetes: Secondary | ICD-10-CM | POA: Diagnosis not present

## 2023-08-07 DIAGNOSIS — E7800 Pure hypercholesterolemia, unspecified: Secondary | ICD-10-CM | POA: Diagnosis not present

## 2023-08-07 DIAGNOSIS — D84821 Immunodeficiency due to drugs: Secondary | ICD-10-CM | POA: Diagnosis not present

## 2023-08-08 DIAGNOSIS — E119 Type 2 diabetes mellitus without complications: Secondary | ICD-10-CM | POA: Diagnosis not present

## 2023-08-08 DIAGNOSIS — Z961 Presence of intraocular lens: Secondary | ICD-10-CM | POA: Diagnosis not present

## 2023-08-08 DIAGNOSIS — H18513 Endothelial corneal dystrophy, bilateral: Secondary | ICD-10-CM | POA: Diagnosis not present

## 2023-08-08 DIAGNOSIS — H52203 Unspecified astigmatism, bilateral: Secondary | ICD-10-CM | POA: Diagnosis not present

## 2023-08-21 DIAGNOSIS — L4050 Arthropathic psoriasis, unspecified: Secondary | ICD-10-CM | POA: Diagnosis not present

## 2023-09-11 ENCOUNTER — Other Ambulatory Visit: Payer: Self-pay | Admitting: Obstetrics and Gynecology

## 2023-09-11 DIAGNOSIS — N644 Mastodynia: Secondary | ICD-10-CM

## 2023-10-02 ENCOUNTER — Ambulatory Visit: Admission: RE | Admit: 2023-10-02 | Payer: PPO | Source: Ambulatory Visit

## 2023-10-02 ENCOUNTER — Ambulatory Visit
Admission: RE | Admit: 2023-10-02 | Discharge: 2023-10-02 | Disposition: A | Discharge status: Home / Self Care | Payer: PPO | Source: Ambulatory Visit | Attending: Obstetrics and Gynecology | Admitting: Obstetrics and Gynecology

## 2023-10-02 DIAGNOSIS — N644 Mastodynia: Secondary | ICD-10-CM

## 2024-05-28 ENCOUNTER — Other Ambulatory Visit: Payer: Self-pay | Admitting: Family Medicine

## 2024-05-28 DIAGNOSIS — N644 Mastodynia: Secondary | ICD-10-CM

## 2024-06-11 ENCOUNTER — Ambulatory Visit
Admission: RE | Admit: 2024-06-11 | Discharge: 2024-06-11 | Disposition: A | Discharge status: Home / Self Care | Source: Ambulatory Visit | Attending: Family Medicine | Admitting: Family Medicine

## 2024-06-11 DIAGNOSIS — N644 Mastodynia: Secondary | ICD-10-CM

## 2024-08-15 ENCOUNTER — Other Ambulatory Visit (HOSPITAL_BASED_OUTPATIENT_CLINIC_OR_DEPARTMENT_OTHER): Payer: Self-pay | Admitting: Family Medicine

## 2024-08-15 DIAGNOSIS — E2839 Other primary ovarian failure: Secondary | ICD-10-CM
